# Patient Record
Sex: Male | Born: 1975 | Race: Black or African American | Hispanic: No | Marital: Single | Smoking: Never smoker
Health system: Southern US, Community
[De-identification: ages and names within clinical notes are randomized; demographics above are authoritative.]

## PROBLEM LIST (undated history)

## (undated) ENCOUNTER — Emergency Department (HOSPITAL_COMMUNITY): Payer: 59

## (undated) DIAGNOSIS — I639 Cerebral infarction, unspecified: Secondary | ICD-10-CM

## (undated) DIAGNOSIS — E785 Hyperlipidemia, unspecified: Secondary | ICD-10-CM

## (undated) DIAGNOSIS — I517 Cardiomegaly: Secondary | ICD-10-CM

## (undated) DIAGNOSIS — I10 Essential (primary) hypertension: Secondary | ICD-10-CM

## (undated) HISTORY — DX: Essential (primary) hypertension: I10

## (undated) HISTORY — PX: ANKLE FRACTURE SURGERY: SHX122

## (undated) HISTORY — DX: Cardiomegaly: I51.7

## (undated) HISTORY — DX: Hyperlipidemia, unspecified: E78.5

## (undated) HISTORY — DX: Cerebral infarction, unspecified: I63.9

---

## 2013-10-22 HISTORY — PX: ORIF ANKLE FRACTURE: SHX5408

## 2014-01-11 ENCOUNTER — Emergency Department (HOSPITAL_COMMUNITY): Payer: Self-pay

## 2014-01-11 ENCOUNTER — Encounter (HOSPITAL_COMMUNITY): Payer: Self-pay | Admitting: Emergency Medicine

## 2014-01-11 ENCOUNTER — Emergency Department (HOSPITAL_COMMUNITY)
Admission: EM | Admit: 2014-01-11 | Discharge: 2014-01-11 | Disposition: A | Discharge status: Home / Self Care | Payer: Self-pay | Attending: Emergency Medicine | Admitting: Emergency Medicine

## 2014-01-11 DIAGNOSIS — S8253XA Displaced fracture of medial malleolus of unspecified tibia, initial encounter for closed fracture: Secondary | ICD-10-CM | POA: Insufficient documentation

## 2014-01-11 DIAGNOSIS — S82391A Other fracture of lower end of right tibia, initial encounter for closed fracture: Secondary | ICD-10-CM

## 2014-01-11 DIAGNOSIS — S8251XA Displaced fracture of medial malleolus of right tibia, initial encounter for closed fracture: Secondary | ICD-10-CM

## 2014-01-11 DIAGNOSIS — S82899A Other fracture of unspecified lower leg, initial encounter for closed fracture: Secondary | ICD-10-CM | POA: Insufficient documentation

## 2014-01-11 DIAGNOSIS — Z7982 Long term (current) use of aspirin: Secondary | ICD-10-CM | POA: Insufficient documentation

## 2014-01-11 DIAGNOSIS — Y939 Activity, unspecified: Secondary | ICD-10-CM | POA: Insufficient documentation

## 2014-01-11 DIAGNOSIS — Y929 Unspecified place or not applicable: Secondary | ICD-10-CM | POA: Insufficient documentation

## 2014-01-11 DIAGNOSIS — S82831A Other fracture of upper and lower end of right fibula, initial encounter for closed fracture: Secondary | ICD-10-CM

## 2014-01-11 DIAGNOSIS — W108XXA Fall (on) (from) other stairs and steps, initial encounter: Secondary | ICD-10-CM | POA: Insufficient documentation

## 2014-01-11 MED ORDER — OXYCODONE-ACETAMINOPHEN 5-325 MG PO TABS: ORAL_TABLET | ORAL | Status: DC

## 2014-01-11 MED ORDER — IBUPROFEN 800 MG PO TABS
800.0000 mg | ORAL_TABLET | Freq: Once | ORAL | Status: AC
  Administered 2014-01-11: 800 mg via ORAL
  Filled 2014-01-11: qty 1

## 2014-01-11 MED ORDER — OXYCODONE-ACETAMINOPHEN 5-325 MG PO TABS
2.0000 | ORAL_TABLET | Freq: Once | ORAL | Status: AC
  Administered 2014-01-11: 2 via ORAL
  Filled 2014-01-11: qty 2

## 2014-01-11 MED ORDER — IBUPROFEN 600 MG PO TABS: 600.0000 mg | ORAL_TABLET | Freq: Four times a day (QID) | ORAL | Status: DC | PRN

## 2014-01-11 NOTE — ED Notes (Signed)
Pt given CD with images for orthopedic MD

## 2014-01-11 NOTE — ED Notes (Signed)
Pt fell down stairs yesterday.  Pt landed back on right foot/ankle.  Pt foot and ankle is swollen.  Took aspirin at home without relief.

## 2014-01-11 NOTE — ED Provider Notes (Signed)
CSN: [id number]     Arrival date & time 01/11/14  1233 History  This chart was scribed for non-physician practitioner, Noland Fordyce, PA-C working with Tanna Furry, MD by Frederich Balding, ED scribe. This patient was seen in room WTR7/WTR7 and the patient's care was started at 2:38 PM.   Chief Complaint  Patient presents with  . Ankle Injury   The history is provided by the patient. No language interpreter was used.   HPI Comments: Clovis Mankins is a 38 y.o. male who presents to the Emergency Department complaining of a fall that occurred yesterday. Pt tripped and fell down the steps and landed on his back and right ankle. Denies hitting his head or LOC. He has sudden onset right ankle pain with associated swelling. Pt also has mild numbness and tingling. He has taken two aspirin tablets yesterday with no relief. Bearing weight and movement worsen the pain. Denies knee pain. Denies history of diabetes. Pt has broken his left great toe in the past but denies prior surgeries.  History reviewed. No pertinent past medical history. History reviewed. No pertinent past surgical history. History reviewed. No pertinent family history. History  Substance Use Topics  . Smoking status: Never Smoker   . Smokeless tobacco: Not on file  . Alcohol Use: No    Review of Systems  Musculoskeletal: Positive for arthralgias and joint swelling.  Neurological: Positive for numbness.  All other systems reviewed and are negative.   Allergies  Quinine derivatives  Home Medications   Current Outpatient Rx  Name  Route  Sig  Dispense  Refill  . ASPIRIN PO   Oral   Take 2 tablets by mouth once.         Marland Kitchen ibuprofen (ADVIL,MOTRIN) 600 MG tablet   Oral   Take 1 tablet (600 mg total) by mouth every 6 (six) hours as needed.   30 tablet   0   . oxyCODONE-acetaminophen (PERCOCET/ROXICET) 5-325 MG per tablet      Take 1-2 pills every 4-6 hours as needed for pain.   15 tablet   0     BP 138/92  Pulse 84   Temp(Src) 99.3 F (37.4 C) (Oral)  Resp 18  SpO2 98%  Physical Exam  Nursing note and vitals reviewed. Constitutional: He is oriented to person, place, and time. He appears well-developed and well-nourished.  HENT:  Head: Normocephalic and atraumatic.  Eyes: EOM are normal.  Neck: Normal range of motion.  Cardiovascular: Normal rate.   Pulmonary/Chest: Effort normal.  Musculoskeletal: Normal range of motion.  Moderate to severe edema of right ankle and foot. Decreased ROM due to pain. Tenderness to lateral malleolus, medial malleolus and dorsum of foot. Dorsalis pedis 2+. Sensation to light touch intact. Skin is intact. No ecchymosis or erythema.   Neurological: He is alert and oriented to person, place, and time.  Skin: Skin is warm and dry.  Psychiatric: He has a normal mood and affect. His behavior is normal.    ED Course  Procedures (including critical care time)  DIAGNOSTIC STUDIES: Oxygen Saturation is 98% on RA, normal by my interpretation.    COORDINATION OF CARE: 2:42 PM-Discussed treatment plan which includes pain medication and orthopedic consult with pt at bedside and pt agreed to plan.   Labs Review Labs Reviewed - No data to display Imaging Review Dg Ankle Complete Right  01/11/2014   CLINICAL DATA:  Right foot and ankle pain post injury yesterday  EXAM: RIGHT ANKLE - COMPLETE 3+  VIEW  COMPARISON:  None.  FINDINGS: Three views of the right ankle submitted. There is displaced oblique fracture in distal right fibular shaft. Nondisplaced fracture is noted in distal tibia medial malleolus. There is significant soft tissue swelling adjacent to lateral malleolus. Ankle mortise is preserved. On lateral view there is subtle cortical step-off posterior aspect of distal tibia suspicious for subtle nondisplaced fracture of posterior malleolus.  IMPRESSION: There is displaced oblique fracture in distal right fibular shaft. Nondisplaced fracture is noted in distal tibia medial  malleolus. There is significant soft tissue swelling adjacent to lateral malleolus. Ankle mortise is preserved. On lateral view there is subtle cortical step-off posterior aspect of distal tibia suspicious for subtle nondisplaced fracture of posterior malleolus.   Electronically Signed   By: Lahoma Crocker M.D.   On: 01/11/2014 13:24   Dg Foot Complete Right  01/11/2014   CLINICAL DATA:  Injury to right foot and ankle.  EXAM: RIGHT FOOT COMPLETE - 3+ VIEW  COMPARISON:  DG ANKLE COMPLETE*R* dated 01/11/2014  FINDINGS: Again noted is the comminuted fracture involving the distal tibia. There is also a displaced fracture of the distal fibula. Alignment of the foot is normal but difficult to exclude an avulsion injury near the tarsometatarsal joints on the lateral view. There is diffuse soft tissue swelling.  IMPRESSION: Fractures involving the distal tibia and fibula.  Questionable area along the dorsal aspect of the tarsometatarsal joints on the lateral view. This could represent overlying bones but difficult to exclude an avulsion injury. Consider clinical correlation in this area.   Electronically Signed   By: Markus Daft M.D.   On: 01/11/2014 13:37     EKG Interpretation None      MDM   Final diagnoses:  Fracture of fibula, distal, right, closed  Fracture of medial malleolus, right, closed  Closed fracture of posterior malleolus of right tibia    Discussed pt with Dr. Jeneen Rinks, pt appears to have unstable ankle fracture requiring surgery, however, skin in tact. Foot is neurovascularly in tact. Ankle mortise is preserved. Will consult orthopedics, Dr. Percell Miller.  Dr. Percell Miller advised to splint leg, strict instructions to keep leg elevated above his heart and f/u with him in his office on Wednesday, 01/13/14 in the morning.  Discussed this with pt and family members who agreed with tx plan. Rx: percocet and ibuprofen. Return precautions provided. Pt verbalized understanding and agreement with tx plan.  I  personally performed the services described in this documentation, which was scribed in my presence. The recorded information has been reviewed and is accurate.   Noland Fordyce, PA-C 01/11/14 [id number]

## 2014-01-11 NOTE — Discharge Instructions (Signed)
Cast or Splint Care Casts and splints support injured limbs and keep bones from moving while they heal.  HOME CARE  Keep the cast or splint uncovered during the drying period.  A plaster cast can take 24 to 48 hours to dry.  A fiberglass cast will dry in less than 1 hour.  Do not rest the cast on anything harder than a pillow for 24 hours.  Do not put weight on your injured limb. Do not put pressure on the cast. Wait for your doctor's approval.  Keep the cast or splint dry.  Cover the cast or splint with a plastic bag during baths or wet weather.  If you have a cast over your chest and belly (trunk), take sponge baths until the cast is taken off.  If your cast gets wet, dry it with a towel or blow dryer. Use the cool setting on the blow dryer.  Keep your cast or splint clean. Wash a dirty cast with a damp cloth.  Do not put any objects under your cast or splint.  Do not scratch the skin under the cast with an object. If itching is a problem, use a blow dryer on a cool setting over the itchy area.  Do not trim or cut your cast.  Do not take out the padding from inside your cast.  Exercise your joints near the cast as told by your doctor.  Raise (elevate) your injured limb on 1 or 2 pillows for the first 1 to 3 days. GET HELP IF:  Your cast or splint cracks.  Your cast or splint is too tight or too loose.  You itch badly under the cast.  Your cast gets wet or has a soft spot.  You have a bad smell coming from the cast.  You get an object stuck under the cast.  Your skin around the cast becomes red or sore.  You have new or more pain after the cast is put on. GET HELP RIGHT AWAY IF:  You have fluid leaking through the cast.  You cannot move your fingers or toes.  Your fingers or toes turn blue or white or are cool, painful, or puffy (swollen).  You have tingling or lose feeling (numbness) around the injured area.  You have bad pain or pressure under the  cast.  You have trouble breathing or have shortness of breath.  You have chest pain. Document Released: 02/07/2011 Document Revised: 06/10/2013 Document Reviewed: 04/16/2013 [facility] Patient Information 2014 [place].  Displaced Fibular Fracture (Adult, Ankle) Treated With ORIF Your displaced fracture is at the part of the fibula that is located at your ankle. Displaced means that the bone is not lined up correctly. Because of this, the bones must be put back into position with a procedure called open reduction and internal fixation (ORIF). ORIF ankle surgery will provide the best chance for your ankle to heal right and decrease the chances of later arthritis and disability.  LET [facility] CARE PROVIDER KNOW ABOUT:  Any allergies you have.  All medicines you are taking, including vitamins, herbs, eye drops, creams, and over-the-counter medicines.  Previous problems you or members of your family have had with the use of anesthetics.  Any blood disorders you have.  Previous surgeries you have had.  Medical conditions you have. RISKS AND COMPLICATIONS Generally, this is a safe procedure. However, as with any procedure, complications can occur. Possible complications include:  Excessive bleeding.  Infection.  Posttraumatic arthritis.  Failure to  heal properly resulting in an unstable ankle.  Stiffness of the ankle after the repair. BEFORE THE PROCEDURE  Do not eat or drink after midnight the day before your procedure, or as instructed by your health care provider.   Ask your health care provider about changing or stopping any regular medicines. You may need to stop taking certain medicines up to 1 week before the procedure.   You may have lab tests the morning of the procedure.   Make arrangements for someone to drive you home after your procedure. PROCEDURE   An IV tube will be inserted into one of your veins.  You will receive fluids and medicine to make you  relax through this tube.  You will then be given medicines to make you sleep (general anesthetic).  A cut (incision) will be made on the outside of your ankle to expose the bone and clean it out.  The bone is then aligned back together, and screws and plates are used to hold this in place.  The skin and tissue are sewn back together to cover the repaired bone.  Dressings are placed over your incision. AFTER THE PROCEDURE  You will be taken to a recovery area and monitored until the anesthetic wears off.  You will be given pain medicine to control the pain.  You will be discharged after your pain is controlled and you can drink liquids. Document Released: 10/08/2005 Document Revised: 07/29/2013 Document Reviewed: 05/07/2013 [facility] Patient Information 2014 [place], [place].

## 2014-01-11 NOTE — Progress Notes (Signed)
[facility] provided pt with a list of primary care resources and a [facility] Orange Card application to help patient establish primary care.  °

## 2014-01-16 NOTE — ED Provider Notes (Signed)
Medical screening examination/treatment/procedure(s) were performed by non-physician practitioner and as supervising physician I was immediately available for consultation/collaboration.   EKG Interpretation None        Baker Kogler, MD 01/16/14 1047 

## 2015-10-07 IMAGING — CR DG ANKLE COMPLETE 3+V*R*
3 series · 3 of 3 positions shown · non-contrast
Comparison: None.

CLINICAL DATA: Right foot and ankle pain post injury yesterday

EXAM:
RIGHT ANKLE - COMPLETE 3+ VIEW

[x ankle ap right]
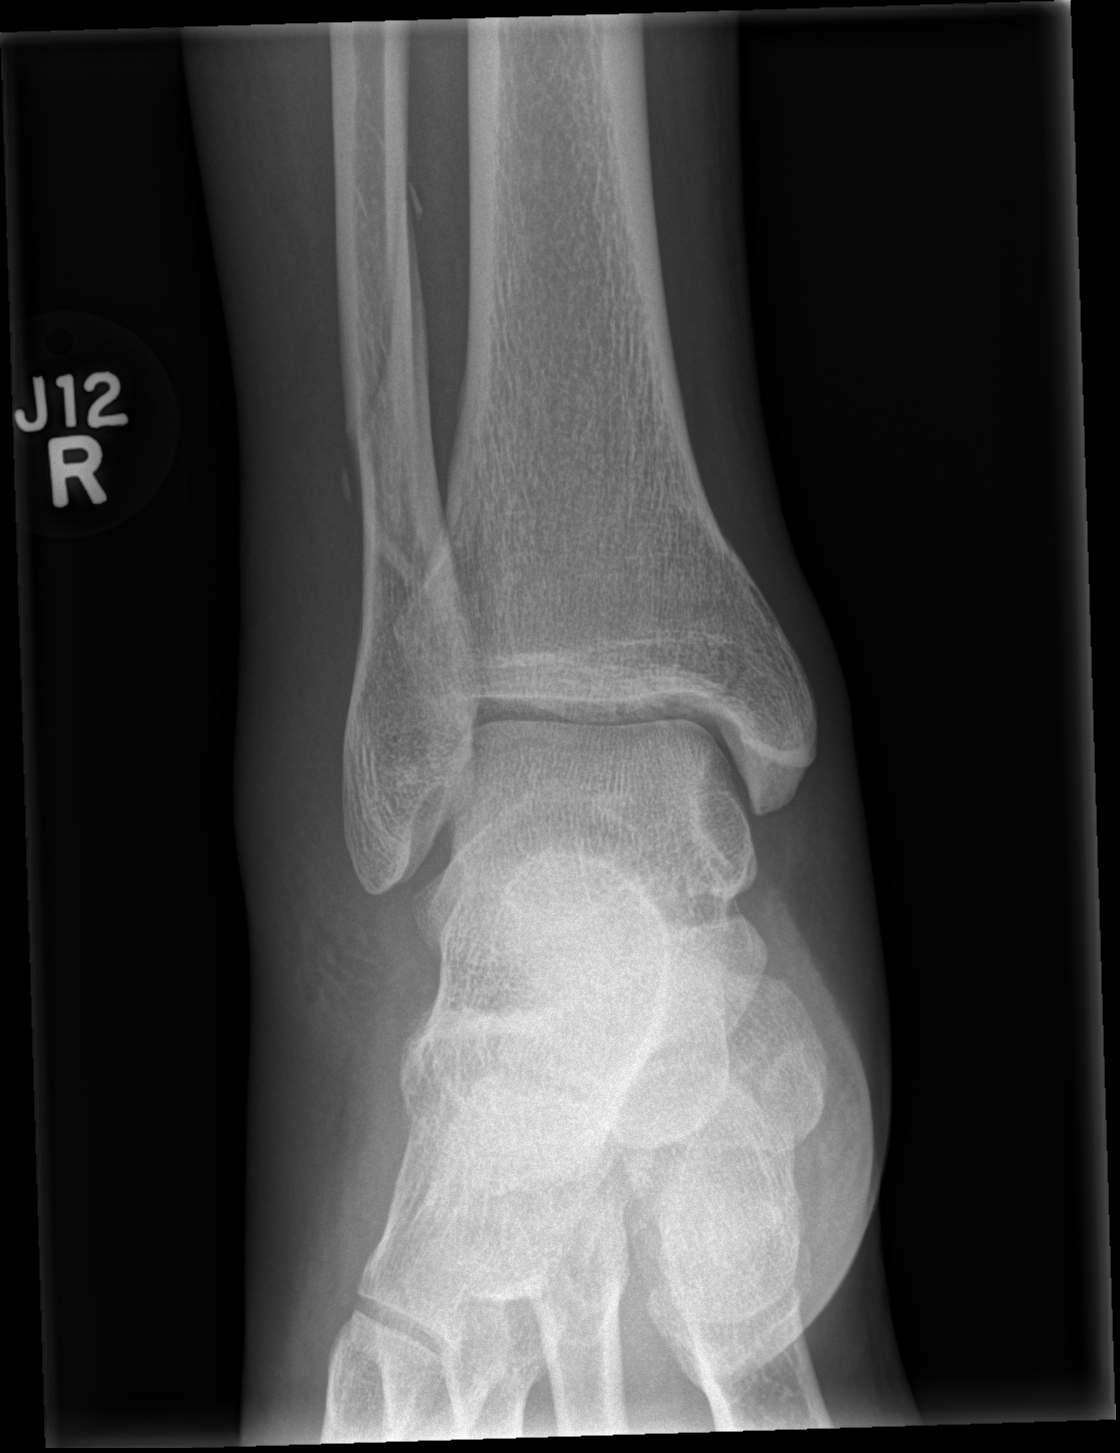

[x ankle obl right]
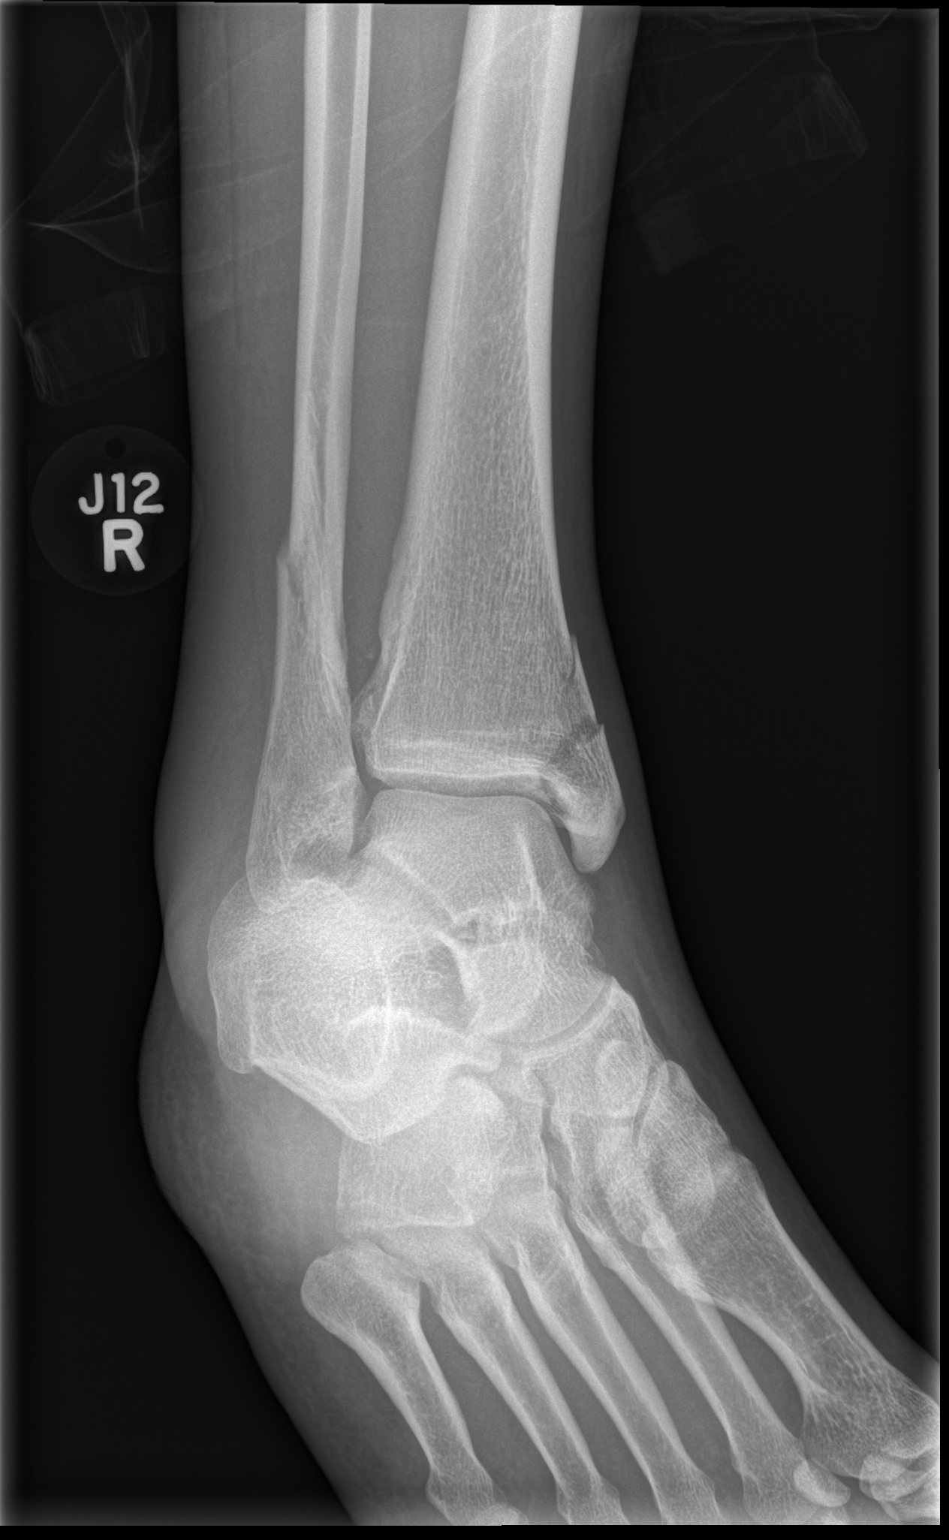

[x ankle lat right]
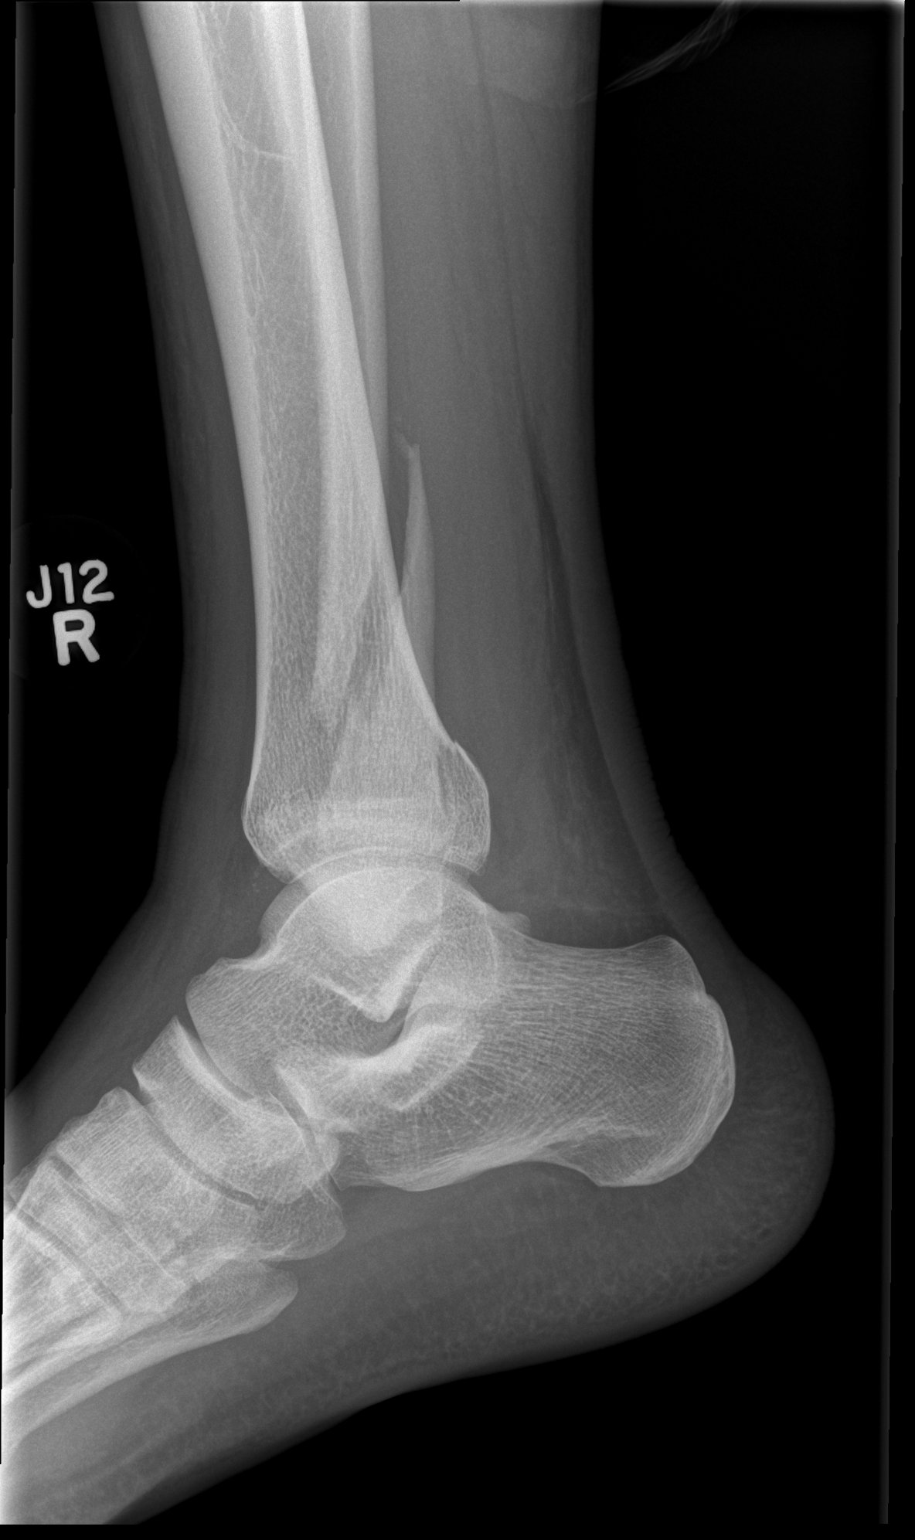

[3 of 3 positions shown; findings below may reference images not displayed]

FINDINGS: Three views of the right ankle submitted. There is displaced oblique
fracture in distal right fibular shaft. Nondisplaced fracture is
noted in distal tibia medial malleolus. There is significant soft
tissue swelling adjacent to lateral malleolus. Ankle mortise is
preserved. On lateral view there is subtle cortical step-off
posterior aspect of distal tibia suspicious for subtle nondisplaced
fracture of posterior malleolus.
IMPRESSION: There is displaced oblique fracture in distal right fibular shaft.
Nondisplaced fracture is noted in distal tibia medial malleolus.
There is significant soft tissue swelling adjacent to lateral
malleolus. Ankle mortise is preserved. On lateral view there is
subtle cortical step-off posterior aspect of distal tibia suspicious
for subtle nondisplaced fracture of posterior malleolus.

## 2015-11-23 ENCOUNTER — Encounter (HOSPITAL_BASED_OUTPATIENT_CLINIC_OR_DEPARTMENT_OTHER): Payer: Self-pay | Admitting: *Deleted

## 2015-11-23 ENCOUNTER — Emergency Department (HOSPITAL_BASED_OUTPATIENT_CLINIC_OR_DEPARTMENT_OTHER)
Admission: EM | Admit: 2015-11-23 | Discharge: 2015-11-23 | Disposition: A | Discharge status: Home / Self Care | Payer: BLUE CROSS/BLUE SHIELD | Attending: Emergency Medicine | Admitting: Emergency Medicine

## 2015-11-23 DIAGNOSIS — D170 Benign lipomatous neoplasm of skin and subcutaneous tissue of head, face and neck: Secondary | ICD-10-CM | POA: Diagnosis not present

## 2015-11-23 DIAGNOSIS — R221 Localized swelling, mass and lump, neck: Secondary | ICD-10-CM | POA: Diagnosis present

## 2015-11-23 DIAGNOSIS — D179 Benign lipomatous neoplasm, unspecified: Secondary | ICD-10-CM

## 2015-11-23 NOTE — ED Notes (Signed)
Patient states he has a growth behind in neck for the last three years.  States he was seen once when the area was small.  Denies pain, drainage or problems with rom.  States the growth continues to grow and he is concerned.

## 2015-11-23 NOTE — ED Provider Notes (Signed)
CSN: [id number]     Arrival date & time 11/23/15  [id number] History   First MD Initiated Contact with Patient 11/23/15 0957     Chief Complaint  Patient presents with  . Mass    cervical spine    HPI   40 year old male presents today with a mass to his posterior neck. Patient reports that approximately 3 years ago he noted a small bump on the right posterior neck that has slowly progressed. He reports the area is not painful, but has continued to progress to its present state. He denies any fever, chills, weight loss, night sweats, decreased range of motion of the neck, pain, or any other areas with similar findings. He denies any redness, discharge, warmth to touch.  History reviewed. No pertinent past medical history. Past Surgical History  Procedure Laterality Date  . Ankle fracture surgery Right    No family history on file. Social History  Substance Use Topics  . Smoking status: Never Smoker   . Smokeless tobacco: None  . Alcohol Use: No    Review of Systems  All other systems reviewed and are negative.   Allergies  Review of patient's allergies indicates no known allergies.  Home Medications   Prior to Admission medications   Medication Sig Start Date End Date Taking? Authorizing Provider  Acetaminophen (TYLENOL PO) Take by mouth.   Yes Historical Provider, MD   BP 146/99 mmHg  Pulse 66  Temp(Src) 98.4 F (36.9 C) (Oral)  Resp 20  Ht 5\' 3"  (1.6 m)  Wt 79 kg  BMI 30.86 kg/m2  SpO2 100%   Physical Exam  Constitutional: He is oriented to person, place, and time. He appears well-developed and well-nourished.  HENT:  Head: Normocephalic and atraumatic.  Eyes: Conjunctivae are normal. Pupils are equal, round, and reactive to light. Right eye exhibits no discharge. Left eye exhibits no discharge. No scleral icterus.  Neck: Normal range of motion. No JVD present. No tracheal deviation present.  Pulmonary/Chest: Effort normal. No stridor.  Musculoskeletal:  Large mass  to the right posterior neck approximately 6 cm in width, no fluctuance, mobile, well demarcated  Neurological: He is alert and oriented to person, place, and time. Coordination normal.  Psychiatric: He has a normal mood and affect. His behavior is normal. Judgment and thought content normal.  Nursing note and vitals reviewed.     ED Course  Procedures (including critical care time) Labs Review Labs Reviewed - No data to display  Imaging Review No results found. I have personally reviewed and evaluated these images and lab results as part of my medical decision-making.   EKG Interpretation None      MDM   Final diagnoses:  Lipoma    Labs:  Imaging:  Consults:  Therapeutics:  Discharge Meds:   Assessment/Plan: 57-year-old male presents with a mass to the back of his neck. This most likely represents a lipoma. Patient has no findings on physical historical exam that would indicate malignant nature of the mass. In any event patient will need follow-up with surgical consult for further evaluation. I informed patient that this is likely nonmalignant, but he would need definitive diagnosis with surgeon. I instructed patient to contact her surgeon this week for scheduled follow-up visit. Patient verbalized understanding and agreement today's plan and had no further questions or concerns at time of discharge.         Okey Regal, PA-C 11/23/15 1059  Julianne Rice, MD 11/23/15 [phone]

## 2015-11-23 NOTE — Discharge Instructions (Signed)

## 2017-01-28 ENCOUNTER — Encounter (HOSPITAL_COMMUNITY): Payer: Self-pay | Admitting: Emergency Medicine

## 2020-01-18 ENCOUNTER — Other Ambulatory Visit: Payer: Self-pay

## 2020-01-18 ENCOUNTER — Emergency Department (HOSPITAL_COMMUNITY): Payer: Managed Care, Other (non HMO)

## 2020-01-18 ENCOUNTER — Emergency Department (HOSPITAL_COMMUNITY)
Admission: EM | Admit: 2020-01-18 | Discharge: 2020-01-18 | Disposition: A | Discharge status: Home / Self Care | Payer: Managed Care, Other (non HMO) | Attending: Emergency Medicine | Admitting: Emergency Medicine

## 2020-01-18 ENCOUNTER — Encounter (HOSPITAL_COMMUNITY): Payer: Self-pay | Admitting: Emergency Medicine

## 2020-01-18 DIAGNOSIS — M7918 Myalgia, other site: Secondary | ICD-10-CM | POA: Diagnosis not present

## 2020-01-18 DIAGNOSIS — R509 Fever, unspecified: Secondary | ICD-10-CM | POA: Diagnosis present

## 2020-01-18 DIAGNOSIS — R05 Cough: Secondary | ICD-10-CM | POA: Insufficient documentation

## 2020-01-18 DIAGNOSIS — U071 COVID-19: Secondary | ICD-10-CM | POA: Insufficient documentation

## 2020-01-18 DIAGNOSIS — R110 Nausea: Secondary | ICD-10-CM | POA: Insufficient documentation

## 2020-01-18 LAB — POC SARS CORONAVIRUS 2 AG -  ED: SARS Coronavirus 2 Ag: POSITIVE — AB

## 2020-01-18 MED ORDER — ACETAMINOPHEN 325 MG PO TABS
650.0000 mg | ORAL_TABLET | Freq: Once | ORAL | Status: AC
Start: 2020-01-18 — End: 2020-01-18
  Administered 2020-01-18: 18:00:00 650 mg via ORAL
  Filled 2020-01-18: qty 2

## 2020-01-18 NOTE — Discharge Instructions (Signed)
Follow isolation precautions as discussed, follow CDC guidelines as discussed.  Return to ER if you develop difficulty breathing, chest pain or other new concerning symptom.  Use Tylenol Motrin as needed for pain control and body aches.

## 2020-01-18 NOTE — ED Triage Notes (Signed)
Pt to ED with c/o Covid like symptoms.  Pt st's last week he started having elevated temp, cough, and vomiting.   Pt st's he took Ibuprofen this am

## 2020-01-18 NOTE — ED Notes (Addendum)
Patient verbalizes understanding of discharge instructions. Opportunity for questioning and answers were provided. Armband removed by staff, pt discharged from ED stable & ambulatory  

## 2020-01-19 ENCOUNTER — Telehealth: Payer: Self-pay | Admitting: Unknown Physician Specialty

## 2020-01-19 ENCOUNTER — Telehealth (INDEPENDENT_AMBULATORY_CARE_PROVIDER_SITE_OTHER): Payer: Self-pay | Admitting: Adult Health

## 2020-01-19 ENCOUNTER — Telehealth: Payer: Self-pay | Admitting: Nurse Practitioner

## 2020-01-19 NOTE — Telephone Encounter (Signed)
Called to discuss with patient about Covid symptoms and the use of bamlanivimab, a monoclonal antibody infusion for those with mild to moderate Covid symptoms and at a high risk of hospitalization.  Pt may be qualified for this infusion at the [facility] infusion center  Message left to call back

## 2020-01-19 NOTE — Telephone Encounter (Signed)
Called to discuss with patient about SARS-CoV- 2 + test,   COVID-19 symptoms and the use of bamlanivimab, a monoclonal antibody infusion for those with mild to moderate Covid symptoms and at a high risk of hospitalization.  Pt did not qualify for infusion- BMI 31 (66 inches, 200 lbs) He does not have any other qualifying co-morbidities. Dicussed Red Flag sx's and if any develop seek immediate medical assistance, he verba.ized understanding/agreement.  Mina Marble, NP-C

## 2020-01-19 NOTE — Telephone Encounter (Signed)
Called to Discuss with patient about Covid symptoms and the use of bamlanivimab, a monoclonal antibody infusion for those with mild to moderate Covid symptoms and at a high risk of hospitalization.     Pt is qualified for this infusion at the [facility] infusion center due to co-morbid conditions and/or a member of an at-risk group.     Unable to reach pt  

## 2020-01-21 NOTE — ED Provider Notes (Signed)
[place] EMERGENCY DEPARTMENT Provider Note   CSN: [id number] Arrival date & time: 01/18/20  1621     History Chief Complaint  Patient presents with  . Possible Covid    Luis Roth is a 44 y.o. male.  Presents to ER with concern for Covid-like symptoms.  Patient states sometime last week he started having intermittent fever, unsure what temperature was at home.  Also having a dry cough, some nausea, no vomiting today.  Also been having some intermittent body aches.  No headache, no neck pain.  Has been taking ibuprofen for symptoms which seems to be helping.  Denies any difficulty in breathing or chest pain.  HPI     History reviewed. No pertinent past medical history.  There are no problems to display for this patient.   Past Surgical History:  Procedure Laterality Date  . ANKLE FRACTURE SURGERY Right        No family history on file.  Social History   Tobacco Use  . Smoking status: Never Smoker  . Smokeless tobacco: Never Used  Substance Use Topics  . Alcohol use: No  . Drug use: No    Home Medications Prior to Admission medications   Medication Sig Start Date End Date Taking? Authorizing Provider  Acetaminophen (TYLENOL PO) Take by mouth.    [provider]  ASPIRIN PO Take 2 tablets by mouth once.    [provider]  ibuprofen (ADVIL,MOTRIN) 600 MG tablet Take 1 tablet (600 mg total) by mouth every 6 (six) hours as needed. 01/11/14   Noe Gens, PA-C  oxyCODONE-acetaminophen (PERCOCET/ROXICET) 5-325 MG per tablet Take 1-2 pills every 4-6 hours as needed for pain. 01/11/14   Noe Gens, PA-C    Allergies    Quinine derivatives  Review of Systems   Review of Systems  Constitutional: Positive for chills and fever.  HENT: Negative for ear pain and sore throat.   Eyes: Negative for pain and visual disturbance.  Respiratory: Positive for cough. Negative for shortness of breath.   Cardiovascular: Negative for  chest pain and palpitations.  Gastrointestinal: Positive for nausea. Negative for abdominal pain and vomiting.  Genitourinary: Negative for dysuria and hematuria.  Musculoskeletal: Negative for arthralgias and back pain.  Skin: Negative for color change and rash.  Neurological: Negative for seizures and syncope.  All other systems reviewed and are negative.   Physical Exam Updated Vital Signs BP 120/84   Pulse 66   Temp 98.5 F (36.9 C) (Oral)   Resp 13   Wt 90.7 kg   SpO2 96%   BMI 35.43 kg/m   Physical Exam Vitals and nursing note reviewed.  Constitutional:      Appearance: He is well-developed.  HENT:     Head: Normocephalic and atraumatic.  Eyes:     Conjunctiva/sclera: Conjunctivae normal.  Cardiovascular:     Rate and Rhythm: Normal rate and regular rhythm.     Heart sounds: No murmur.  Pulmonary:     Effort: Pulmonary effort is normal. No respiratory distress.     Breath sounds: Normal breath sounds.  Abdominal:     Palpations: Abdomen is soft.     Tenderness: There is no abdominal tenderness.  Musculoskeletal:        General: No deformity or signs of injury.     Cervical back: Neck supple.  Skin:    General: Skin is warm and dry.  Neurological:     General: No focal deficit present.  Mental Status: He is alert and oriented to person, place, and time.  Psychiatric:        Mood and Affect: Mood normal.        Behavior: Behavior normal.     ED Results / Procedures / Treatments   Labs (all labs ordered are listed, but only abnormal results are displayed) Labs Reviewed  POC SARS CORONAVIRUS 2 AG -  ED - Abnormal; Notable for the following components:      Result Value   SARS Coronavirus 2 Ag POSITIVE (*)    All other components within normal limits    EKG None  Radiology No results found.  Procedures Procedures (including critical care time)  Medications Ordered in ED Medications  acetaminophen (TYLENOL) tablet 650 mg (650 mg Oral Given  01/18/20 1748)    ED Course  I have reviewed the triage vital signs and the nursing notes.  Pertinent labs & imaging results that were available during my care of the patient were reviewed by me and considered in my medical decision making (see chart for details).    MDM Rules/Calculators/A&P                      44 year old male presents to ER with Covid-like symptoms.  Point-of-care Covid test was positive.  CXR with mild patchy opacities consistent with Covid pneumonia.  No chest pain, no difficulty breathing, no pulmonary embolism or superficial imposed bacterial pneumonia.  He is well-appearing, no increased work of breathing, no hypoxia, believe he is appropriate for discharge and outpatient management at this time.  Reviewed precautions, isolation precautions. Rec recheck virtual with PCP.    After the discussed management above, the patient was determined to be safe for discharge.  The patient was in agreement with this plan and all questions regarding their care were answered.  ED return precautions were discussed and the patient will return to the ED with any significant worsening of condition.   Final Clinical Impression(s) / ED Diagnoses Final diagnoses:  [id number]    Rx / DC Orders ED Discharge Orders    None       Lucrezia Starch, MD 01/21/20 1514

## 2020-01-27 ENCOUNTER — Other Ambulatory Visit: Payer: Self-pay

## 2020-01-27 ENCOUNTER — Emergency Department (HOSPITAL_COMMUNITY)
Admission: EM | Admit: 2020-01-27 | Discharge: 2020-01-27 | Disposition: A | Discharge status: Home / Self Care | Payer: Managed Care, Other (non HMO) | Attending: Emergency Medicine | Admitting: Emergency Medicine

## 2020-01-27 ENCOUNTER — Encounter (HOSPITAL_COMMUNITY): Payer: Self-pay | Admitting: Pediatrics

## 2020-01-27 DIAGNOSIS — I10 Essential (primary) hypertension: Secondary | ICD-10-CM | POA: Insufficient documentation

## 2020-01-27 DIAGNOSIS — R05 Cough: Secondary | ICD-10-CM | POA: Diagnosis not present

## 2020-01-27 DIAGNOSIS — Z8616 Personal history of COVID-19: Secondary | ICD-10-CM | POA: Diagnosis not present

## 2020-01-27 DIAGNOSIS — Z79899 Other long term (current) drug therapy: Secondary | ICD-10-CM | POA: Insufficient documentation

## 2020-01-27 DIAGNOSIS — U071 COVID-19: Secondary | ICD-10-CM

## 2020-01-27 LAB — BASIC METABOLIC PANEL
Anion gap: 9 (ref 5–15)
BUN: 5 mg/dL — ABNORMAL LOW (ref 6–20)
CO2: 24 mmol/L (ref 22–32)
Calcium: 9.4 mg/dL (ref 8.9–10.3)
Chloride: 104 mmol/L (ref 98–111)
Creatinine, Ser: 1.1 mg/dL (ref 0.61–1.24)
GFR calc Af Amer: >60 mL/min (ref >60)
GFR calc non Af Amer: >60 mL/min (ref >60)
Glucose, Bld: 96 mg/dL (ref 70–99)
Potassium: 4.4 mmol/L (ref 3.5–5.1)
Sodium: 137 mmol/L (ref 135–145)

## 2020-01-27 NOTE — ED Notes (Signed)
Patient verbalizes understanding of discharge instructions and follow up care regarding HTN. Opportunity for questioning and answers were provided. all questions answered completely. PIV removed, catheter intact. Site dressed with gauze and tape. Armband removed by staff, pt discharged from ED. Ambulatory from ED with strong, steady gait

## 2020-01-27 NOTE — ED Notes (Signed)
PIV initiated, 20G to [place]. IV flushes with 10 cc NS without s/s of infiltration. Positive blood return noted. Secured with tape and tegadem. Labs drawn, labeled with 2 pt identifiers, and sent to lab

## 2020-01-27 NOTE — ED Triage Notes (Signed)
Pt presents to ED via POV; Stated his quarantine dates tomorrow; and still have intermittent cough

## 2020-01-27 NOTE — ED Notes (Signed)
Pts BP has not been running high so tech moved to opposite arm and rechecked, still at 197/111

## 2020-01-27 NOTE — ED Provider Notes (Signed)
[facility] EMERGENCY DEPARTMENT Provider Note   CSN: [id number] Arrival date & time: 01/27/20  1353     History Chief Complaint  Patient presents with  . Cough    Luis Roth is a 44 y.o. male with a past medical history of COVID-19 who developed symptoms a little over 10 days ago.  He reports that his work notes that he could go back to work tomorrow and so he wants to make sure he is okay.  He denies any cough.  He is not having any fevers.  He reports that he is still taking zinc, and vitamin C.  He states that he has occasionally been using Robitussin however has not had any recently.  He denies any stimulant use or significant increase in caffeine.    He does not have a known history of hypertension.  HPI     History reviewed. No pertinent past medical history.  There are no problems to display for this patient.   Past Surgical History:  Procedure Laterality Date  . ANKLE FRACTURE SURGERY Right        No family history on file.  Social History   Tobacco Use  . Smoking status: Never Smoker  . Smokeless tobacco: Never Used  Substance Use Topics  . Alcohol use: No  . Drug use: No    Home Medications Prior to Admission medications   Medication Sig Start Date End Date Taking? Authorizing Provider  ascorbic acid (VITAMIN C) 500 MG tablet Take 500 mg by mouth daily.   Yes [provider]  ibuprofen (ADVIL,MOTRIN) 600 MG tablet Take 1 tablet (600 mg total) by mouth every 6 (six) hours as needed. Patient taking differently: Take 600 mg by mouth every 6 (six) hours as needed for moderate pain.  01/11/14  Yes Phelps, Erin O, PA-C  Multiple Vitamins-Minerals (ZINC PO) Take 1 tablet by mouth in the morning and at bedtime.   Yes [provider]  VITAMIN D PO Take 1 tablet by mouth daily.   Yes [provider]  oxyCODONE-acetaminophen (PERCOCET/ROXICET) 5-325 MG per tablet Take 1-2 pills every 4-6 hours as needed for  pain. Patient not taking: Reported on 01/27/2020 01/11/14   Noe Gens, PA-C    Allergies    Quinine derivatives  Review of Systems   Review of Systems  Constitutional: Negative for chills, fatigue and fever.  Respiratory: Positive for cough (Occasionally). Negative for shortness of breath.   Cardiovascular: Negative for chest pain, palpitations and leg swelling.  Gastrointestinal: Negative for abdominal pain, diarrhea, nausea and vomiting.  Musculoskeletal: Negative for arthralgias and myalgias.  Skin: Negative for color change and rash.  Neurological: Negative for weakness and headaches.  Psychiatric/Behavioral: Negative for confusion.  All other systems reviewed and are negative.   Physical Exam Updated Vital Signs BP (!) 183/111   Pulse (!) 51   Temp 97.7 F (36.5 C) (Oral)   Resp (!) 22   SpO2 98%   Physical Exam Vitals and nursing note reviewed.  Constitutional:      General: He is not in acute distress.    Appearance: He is well-developed. He is not diaphoretic.  HENT:     Head: Normocephalic and atraumatic.  Eyes:     General: No scleral icterus.       Right eye: No discharge.        Left eye: No discharge.     Conjunctiva/sclera: Conjunctivae normal.  Cardiovascular:     Rate and Rhythm:  Normal rate and regular rhythm.     Pulses: Normal pulses.     Heart sounds: Normal heart sounds.  Pulmonary:     Effort: Pulmonary effort is normal. No respiratory distress.     Breath sounds: Normal breath sounds. No stridor.  Abdominal:     General: There is no distension.  Musculoskeletal:     Cervical back: Normal range of motion and neck supple. No rigidity.     Right lower leg: No edema.     Left lower leg: No edema.  Skin:    General: Skin is warm and dry.  Neurological:     General: No focal deficit present.     Mental Status: He is alert and oriented to person, place, and time. Mental status is at baseline.     Cranial Nerves: No cranial nerve deficit.      Motor: No weakness or abnormal muscle tone.     Gait: Gait normal.  Psychiatric:        Mood and Affect: Mood normal.        Behavior: Behavior normal.     ED Results / Procedures / Treatments   Labs (all labs ordered are listed, but only abnormal results are displayed) Labs Reviewed  BASIC METABOLIC PANEL - Abnormal; Notable for the following components:      Result Value   BUN 5 (*)    All other components within normal limits    EKG None  Radiology No results found.  Procedures Procedures (including critical care time)  Medications Ordered in ED Medications - No data to display  ED Course  I have reviewed the triage vital signs and the nursing notes.  Pertinent labs & imaging results that were available during my care of the patient were reviewed by me and considered in my medical decision making (see chart for details).  Clinical Course as of Jan 26 2258  Wed Jan 27, 2020  2124 I personally had patient ambulate in place while in the room.  He did not develop any symptoms and remained 99 to 100% on room air without significant tachycardia.     [EH]    Clinical Course User Index [EH] Ollen Gross   MDM Rules/Calculators/A&P                     Patient presents today for evaluation of Covid.  He developed his symptoms 10 days ago.  He wants to make sure he is okay to come off quarantine.  He reports he has occasional cough however otherwise feels fine.  He denies any shortness of breath or fatigue.  He is afebrile, not tachycardic or tachypneic. He was able to ambulate without desaturation, denies any leg pain or swelling or any residual symptoms other than an occasional cough.  Discussed with patient CDC recommendations for when you can come out of isolation after being diagnosed Covid positive.  Here he was noted to be hypertensive.  He denied headache, chest pain shortness of breath or any neurologic symptoms.  This was in the high 130s over 90s  while in the waiting room however jumped to the high 123XX123 systolic once he was roomed.  He does report that he has occasionally been taking cough medicine that contains decongestants.  I recommended that he stop doing this.  Recommended outpatient follow-up with repeat blood pressure ,Check in 2 weeks.  In addition recommended not using decongestants.  Given the sudden onset of his hypertension BMP was  obtained showing normal renal function.  He is not having chest pain or shortness of breath therefore EKG was not obtained.  Luis Roth was evaluated in Emergency Department on 01/27/2020 for the symptoms described in the history of present illness. He was evaluated in the context of the global COVID-19 pandemic, which necessitated consideration that the patient might be at risk for infection with the SARS-CoV-2 virus that causes COVID-19. Institutional protocols and algorithms that pertain to the evaluation of patients at risk for COVID-19 are in a state of rapid change based on information released by regulatory bodies including the CDC and federal and state organizations. These policies and algorithms were followed during the patient's care in the ED.   Return precautions were discussed with patient who states their understanding.  At the time of discharge patient denied any unaddressed complaints or concerns.  Patient is agreeable for discharge home.  Note: Portions of this report may have been transcribed using voice recognition software. Every effort was made to ensure accuracy; however, inadvertent computerized transcription errors may be present  Final Clinical Impression(s) / ED Diagnoses Final diagnoses:  COVID-19  Hypertension, unspecified type    Rx / DC Orders ED Discharge Orders    None       Ollen Gross 01/27/20 2259    Dorie Rank, MD 01/28/20 1029

## 2020-01-27 NOTE — ED Notes (Signed)
EDP at bedside  

## 2020-01-27 NOTE — Discharge Instructions (Addendum)
Your blood work was good today.   To return to work and come off isolation/quarantine you must meet the below criteria. At least 10 days since symptoms first appeared and At least 24 hours with no fever without fever-reducing medication and Symptoms have improved While in the ED your blood pressure was high.  Please follow up with your primary care doctor or the wellness clinic for repeat evaluation as you may need medication.  High blood pressure can cause long term, potentially serious, damage if left untreated.   Do not take any medications with decongestants. If needed for cough you may take Coricidin BP as this will not significantly elevate your blood pressure.

## 2020-06-21 ENCOUNTER — Ambulatory Visit: Payer: Managed Care, Other (non HMO) | Attending: Critical Care Medicine

## 2020-06-21 DIAGNOSIS — Z23 Encounter for immunization: Secondary | ICD-10-CM

## 2020-06-21 NOTE — Progress Notes (Signed)
   Covid-19 Vaccination Clinic  Name:  Luis Roth    MRN: [id number] DOB: [birth year]  06/21/2020  [name] was observed post Covid-19 immunization for 15 minutes without incident. He was provided with Vaccine Information Sheet and instruction to access the V-Safe system.   [name] was instructed to call 911 with any severe reactions post vaccine: Marland Kitchen Difficulty breathing  . Swelling of face and throat  . A fast heartbeat  . A bad rash all over body  . Dizziness and weakness   Immunizations Administered    Name Date Dose VIS Date Route   Moderna COVID-19 Vaccine 06/21/2020  9:13 AM 0.5 mL 09/2019 Intramuscular   Manufacturer: Moderna   Lot: [id number]   [place]: [id number]

## 2020-07-19 ENCOUNTER — Ambulatory Visit: Payer: Managed Care, Other (non HMO) | Attending: Internal Medicine

## 2020-07-19 DIAGNOSIS — Z23 Encounter for immunization: Secondary | ICD-10-CM

## 2020-07-19 NOTE — Progress Notes (Signed)
   Covid-19 Vaccination Clinic  Name:  Luis Roth    MRN: [id number] DOB: [birth year]  07/19/2020  [name] was observed post Covid-19 immunization for 15 minutes without incident. He was provided with Vaccine Information Sheet and instruction to access the V-Safe system.   [name] was instructed to call 911 with any severe reactions post vaccine: Marland Kitchen Difficulty breathing  . Swelling of face and throat  . A fast heartbeat  . A bad rash all over body  . Dizziness and weakness   Immunizations Administered    Name Date Dose VIS Date Route   Moderna COVID-19 Vaccine 07/19/2020  9:15 AM 0.5 mL 09/2019 Intramuscular   Manufacturer: Moderna   Lot: [id number]   [place]: [phone]

## 2022-11-02 ENCOUNTER — Emergency Department (HOSPITAL_COMMUNITY): Payer: 59

## 2022-11-02 ENCOUNTER — Inpatient Hospital Stay (HOSPITAL_COMMUNITY)
Admission: EM | Admit: 2022-11-02 | Discharge: 2022-11-12 | DRG: 065 | Disposition: A | Discharge status: Skilled Nursing Facility | Payer: 59 | Attending: Family Medicine | Admitting: Family Medicine

## 2022-11-02 ENCOUNTER — Observation Stay (HOSPITAL_COMMUNITY): Payer: 59

## 2022-11-02 ENCOUNTER — Other Ambulatory Visit: Payer: Self-pay

## 2022-11-02 ENCOUNTER — Encounter (HOSPITAL_COMMUNITY): Payer: Self-pay

## 2022-11-02 DIAGNOSIS — R4189 Other symptoms and signs involving cognitive functions and awareness: Secondary | ICD-10-CM | POA: Diagnosis present

## 2022-11-02 DIAGNOSIS — I6389 Other cerebral infarction: Secondary | ICD-10-CM | POA: Diagnosis not present

## 2022-11-02 DIAGNOSIS — R9431 Abnormal electrocardiogram [ECG] [EKG]: Secondary | ICD-10-CM | POA: Diagnosis present

## 2022-11-02 DIAGNOSIS — I63521 Cerebral infarction due to unspecified occlusion or stenosis of right anterior cerebral artery: Secondary | ICD-10-CM | POA: Diagnosis not present

## 2022-11-02 DIAGNOSIS — Z6835 Body mass index (BMI) 35.0-35.9, adult: Secondary | ICD-10-CM

## 2022-11-02 DIAGNOSIS — I160 Hypertensive urgency: Secondary | ICD-10-CM | POA: Diagnosis present

## 2022-11-02 DIAGNOSIS — Z888 Allergy status to other drugs, medicaments and biological substances status: Secondary | ICD-10-CM

## 2022-11-02 DIAGNOSIS — E785 Hyperlipidemia, unspecified: Secondary | ICD-10-CM | POA: Diagnosis present

## 2022-11-02 DIAGNOSIS — I639 Cerebral infarction, unspecified: Secondary | ICD-10-CM | POA: Diagnosis not present

## 2022-11-02 DIAGNOSIS — Z79899 Other long term (current) drug therapy: Secondary | ICD-10-CM

## 2022-11-02 DIAGNOSIS — Z823 Family history of stroke: Secondary | ICD-10-CM

## 2022-11-02 DIAGNOSIS — M4802 Spinal stenosis, cervical region: Secondary | ICD-10-CM | POA: Diagnosis present

## 2022-11-02 DIAGNOSIS — R29707 NIHSS score 7: Secondary | ICD-10-CM | POA: Diagnosis present

## 2022-11-02 DIAGNOSIS — I10 Essential (primary) hypertension: Secondary | ICD-10-CM | POA: Diagnosis present

## 2022-11-02 DIAGNOSIS — I517 Cardiomegaly: Secondary | ICD-10-CM | POA: Diagnosis present

## 2022-11-02 DIAGNOSIS — G8194 Hemiplegia, unspecified affecting left nondominant side: Secondary | ICD-10-CM | POA: Diagnosis present

## 2022-11-02 DIAGNOSIS — E669 Obesity, unspecified: Secondary | ICD-10-CM | POA: Diagnosis present

## 2022-11-02 LAB — ETHANOL: Alcohol, Ethyl (B): <10 mg/dL (ref <10)

## 2022-11-02 LAB — ECHOCARDIOGRAM COMPLETE
Area-P 1/2: 3.42 cm2
Height: 63 in
S' Lateral: 2.9 cm
Weight: 3200 oz

## 2022-11-02 LAB — TROPONIN I (HIGH SENSITIVITY)
Troponin I (High Sensitivity): 15 ng/L (ref <18)
Troponin I (High Sensitivity): 17 ng/L (ref <18)

## 2022-11-02 LAB — DIFFERENTIAL
Abs Immature Granulocytes: 0.01 10*3/uL (ref 0.00–0.07)
Basophils Absolute: 0 10*3/uL (ref 0.0–0.1)
Basophils Relative: 0 %
Eosinophils Absolute: 0.1 10*3/uL (ref 0.0–0.5)
Eosinophils Relative: 1 %
Immature Granulocytes: 0 %
Lymphocytes Relative: 25 %
Lymphs Abs: 1.6 10*3/uL (ref 0.7–4.0)
Monocytes Absolute: 0.4 10*3/uL (ref 0.1–1.0)
Monocytes Relative: 6 %
Neutro Abs: 4.3 10*3/uL (ref 1.7–7.7)
Neutrophils Relative %: 68 %

## 2022-11-02 LAB — URINALYSIS, ROUTINE W REFLEX MICROSCOPIC
Bacteria, UA: NONE SEEN
Bilirubin Urine: NEGATIVE
Glucose, UA: NEGATIVE mg/dL
Hgb urine dipstick: NEGATIVE
Ketones, ur: NEGATIVE mg/dL
Leukocytes,Ua: NEGATIVE
Nitrite: NEGATIVE
Protein, ur: 30 mg/dL — AB
Specific Gravity, Urine: >1.046 — ABNORMAL HIGH (ref 1.005–1.030)
pH: 5 (ref 5.0–8.0)

## 2022-11-02 LAB — I-STAT CHEM 8, ED
BUN: 14 mg/dL (ref 6–20)
Calcium, Ion: 1.17 mmol/L (ref 1.15–1.40)
Chloride: 103 mmol/L (ref 98–111)
Creatinine, Ser: 1.2 mg/dL (ref 0.61–1.24)
Glucose, Bld: 144 mg/dL — ABNORMAL HIGH (ref 70–99)
HCT: 41 % (ref 39.0–52.0)
Hemoglobin: 13.9 g/dL (ref 13.0–17.0)
Potassium: 3.5 mmol/L (ref 3.5–5.1)
Sodium: 140 mmol/L (ref 135–145)
TCO2: 26 mmol/L (ref 22–32)

## 2022-11-02 LAB — CBC
HCT: 40.3 % (ref 39.0–52.0)
Hemoglobin: 13.6 g/dL (ref 13.0–17.0)
MCH: 30.4 pg (ref 26.0–34.0)
MCHC: 33.7 g/dL (ref 30.0–36.0)
MCV: 90.2 fL (ref 80.0–100.0)
Platelets: 245 10*3/uL (ref 150–400)
RBC: 4.47 MIL/uL (ref 4.22–5.81)
RDW: 13.4 % (ref 11.5–15.5)
WBC: 6.3 10*3/uL (ref 4.0–10.5)
nRBC: 0 % (ref 0.0–0.2)

## 2022-11-02 LAB — COMPREHENSIVE METABOLIC PANEL
ALT: 27 U/L (ref 0–44)
AST: 32 U/L (ref 15–41)
Albumin: 4 g/dL (ref 3.5–5.0)
Alkaline Phosphatase: 76 U/L (ref 38–126)
Anion gap: 8 (ref 5–15)
BUN: 13 mg/dL (ref 6–20)
CO2: 24 mmol/L (ref 22–32)
Calcium: 9.3 mg/dL (ref 8.9–10.3)
Chloride: 104 mmol/L (ref 98–111)
Creatinine, Ser: 1.29 mg/dL — ABNORMAL HIGH (ref 0.61–1.24)
GFR, Estimated: >60 mL/min (ref >60)
Glucose, Bld: 147 mg/dL — ABNORMAL HIGH (ref 70–99)
Potassium: 3.5 mmol/L (ref 3.5–5.1)
Sodium: 136 mmol/L (ref 135–145)
Total Bilirubin: 0.6 mg/dL (ref 0.3–1.2)
Total Protein: 7.5 g/dL (ref 6.5–8.1)

## 2022-11-02 LAB — RAPID URINE DRUG SCREEN, HOSP PERFORMED
Amphetamines: NOT DETECTED
Barbiturates: NOT DETECTED
Benzodiazepines: NOT DETECTED
Cocaine: NOT DETECTED
Opiates: NOT DETECTED
Tetrahydrocannabinol: NOT DETECTED

## 2022-11-02 LAB — HIV ANTIBODY (ROUTINE TESTING W REFLEX): HIV Screen 4th Generation wRfx: NONREACTIVE

## 2022-11-02 LAB — PROTIME-INR
INR: 1 (ref 0.8–1.2)
Prothrombin Time: 13.5 seconds (ref 11.4–15.2)

## 2022-11-02 LAB — APTT: aPTT: 31 seconds (ref 24–36)

## 2022-11-02 MED ORDER — ASPIRIN 81 MG PO TBEC
81.0000 mg | DELAYED_RELEASE_TABLET | Freq: Every day | ORAL | Status: DC
  Administered 2022-11-02 – 2022-11-12 (×11): 81 mg via ORAL
  Filled 2022-11-02 (×11): qty 1

## 2022-11-02 MED ORDER — ACETAMINOPHEN 650 MG RE SUPP: 650.0000 mg | Freq: Four times a day (QID) | RECTAL | Status: DC | PRN

## 2022-11-02 MED ORDER — ENOXAPARIN SODIUM 40 MG/0.4ML IJ SOSY: 40.0000 mg | PREFILLED_SYRINGE | INTRAMUSCULAR | Status: DC

## 2022-11-02 MED ORDER — ONDANSETRON HCL 4 MG PO TABS
4.0000 mg | ORAL_TABLET | Freq: Four times a day (QID) | ORAL | Status: DC | PRN
  Administered 2022-11-06: 4 mg via ORAL
  Filled 2022-11-02: qty 1

## 2022-11-02 MED ORDER — POLYETHYLENE GLYCOL 3350 17 G PO PACK
17.0000 g | PACK | Freq: Every day | ORAL | Status: DC | PRN
  Administered 2022-11-05 – 2022-11-07 (×3): 17 g via ORAL
  Filled 2022-11-02 (×4): qty 1

## 2022-11-02 MED ORDER — ACETAMINOPHEN 325 MG PO TABS
650.0000 mg | ORAL_TABLET | Freq: Four times a day (QID) | ORAL | Status: DC | PRN
  Administered 2022-11-04 – 2022-11-06 (×2): 650 mg via ORAL
  Filled 2022-11-02 (×2): qty 2

## 2022-11-02 MED ORDER — IOHEXOL 350 MG/ML SOLN
75.0000 mL | Freq: Once | INTRAVENOUS | Status: AC | PRN
  Administered 2022-11-02: 75 mL via INTRAVENOUS

## 2022-11-02 MED ORDER — LACTATED RINGERS IV SOLN: INTRAVENOUS | Status: AC

## 2022-11-02 MED ORDER — ONDANSETRON HCL 4 MG/2ML IJ SOLN: 4.0000 mg | Freq: Four times a day (QID) | INTRAMUSCULAR | Status: DC | PRN

## 2022-11-02 NOTE — Progress Notes (Signed)
FMTS Interim Progress Note  S:Night rounded w/ Dr. Nancy Fetter. Pt is alert in bed, responding appropriately. denies any new changes or discomfort. Says he feels better since admission.  O: BP (!) 188/107 (BP Location: Left Arm)   Pulse (!) 54   Temp 98.4 F (36.9 C) (Oral)   Resp 17   Ht '5\' 3"'$  (1.6 m)   Wt 90.7 kg   SpO2 100%   BMI 35.43 kg/m     A/P: Stroke - BP steadily improving since admission. - Plan per day team  Arlyce Dice, MD 11/02/2022, 9:31 PM PGY-1, [facility] Service pager [phone]

## 2022-11-02 NOTE — Assessment & Plan Note (Addendum)
Stable, awaiting SNF.  - Atorvastatin '80mg'$  daily - Plavix 75 mg for 3 weeks - ASA '81mg'$  daily - amlodipine 10 mg (started 1/14) - Losartan to 100 mg (started 1/16) - PT/OT - Fall precautions

## 2022-11-02 NOTE — Progress Notes (Signed)
  Echocardiogram 2D Echocardiogram has been performed.  Luis Roth 11/02/2022, 4:45 PM

## 2022-11-02 NOTE — ED Provider Notes (Addendum)
2:03 PM I discussed patient's abnormal EKG with Dr. Johnsie Cancel.  Given no chest pain and strokelike symptoms, this is likely more LVH.  However he does recommend getting troponins and an echo as he will get a stroke workup anyway to help make sure there is no structural heart disease.  No STEMI at this time.  On my assessment patient is not having any chest pain but is only having neurosymptoms.   Sherwood Gambler, MD 11/02/22 [phone]

## 2022-11-02 NOTE — Progress Notes (Signed)
Received male pt from ER, alert oriented, not in distress, on room air, transferred to room 22, assisted, pt had severe weakness on the left side of the body, put pt on tele box, informed tele dept. Skin integrity assessed, bth given by NT, activities well tolerated

## 2022-11-02 NOTE — Consult Note (Signed)
NEUROLOGY CONSULTATION NOTE   Date of service: November 02, 2022 Patient Name: Luis Roth MRN:  [id number] DOB:  January 25, 1976 Reason for consult: "stroke" Requesting Provider: Lenoria Chime, MD   History of Present Illness  Beth Spackman is a 47 y.o. male with PMH significant for  has no past medical history on file. who presents to ED c/o left-sided weakness and inability to stand. MRI showed subacute stroke and neurology was consulted.   LKW: Wednesday TNK: No, outside of window GCS: 15 NIHSS: 7  NIHSS components Score: Comment  1a Level of Conscious 0'[x]'$  1'[]'$  2'[]'$  3'[]'$      1b LOC Questions 0'[x]'$  1'[]'$  2'[]'$       1c LOC Commands 0'[x]'$  1'[]'$  2'[]'$       2 Best Gaze 0'[x]'$  1'[]'$  2'[]'$       3 Visual 0'[x]'$  1'[]'$  2'[]'$  3'[]'$      4 Facial Palsy 0'[x]'$  1'[]'$  2'[]'$  3'[]'$      5a Motor Arm - left 0'[]'$  1'[]'$  2'[x]'$  3'[]'$  4'[]'$  UN'[]'$    5b Motor Arm - Right 0'[x]'$  1'[]'$  2'[]'$  3'[]'$  4'[]'$  UN'[]'$    6a Motor Leg - Left 0'[]'$  1'[]'$  2'[]'$  3'[]'$  4'[x]'$  UN'[]'$    6b Motor Leg - Right 0'[x]'$  1'[]'$  2'[]'$  3'[]'$  4'[]'$  UN'[]'$    7 Limb Ataxia 0'[]'$  1'[x]'$  2'[]'$  3'[]'$  UN'[]'$     8 Sensory 0'[x]'$  1'[]'$  2'[]'$  UN'[]'$      9 Best Language 0'[x]'$  1'[]'$  2'[]'$  3'[]'$      10 Dysarthria 0'[x]'$  1'[]'$  2'[]'$  UN'[]'$      11 Extinct. and Inattention 0'[x]'$  1'[]'$  2'[]'$       TOTAL:  7      History provided by patient.    ROS   Constitutional Denies weight loss, fever and chills.   HEENT Denies changes in vision and hearing.   Respiratory Denies SOB and cough.   CV Denies palpitations and CP   GI Denies abdominal pain, nausea, vomiting and diarrhea.   GU Denies dysuria and urinary frequency.   MSK Denies myalgia and joint pain.   Skin Denies rash and pruritus.   Neurological Denies headache and syncope.   Psychiatric Denies recent changes in mood. Denies anxiety and depression.    Past History  History reviewed. No pertinent past medical history. Past Surgical History:  Procedure Laterality Date   ANKLE FRACTURE SURGERY Right    History reviewed. No pertinent family history. Social History   Socioeconomic History    Marital status: Single    Spouse name: Not on file   Number of children: Not on file   Years of education: Not on file   Highest education level: Not on file  Occupational History   Not on file  Tobacco Use   Smoking status: Never   Smokeless tobacco: Never  Substance and Sexual Activity   Alcohol use: No   Drug use: No   Sexual activity: Not on file  Other Topics Concern   Not on file  Social History Narrative   ** Merged History Encounter **       Social Determinants of Health   Financial Resource Strain: Not on file  Food Insecurity: Not on file  Transportation Needs: Not on file  Physical Activity: Not on file  Stress: Not on file  Social Connections: Not on file   Allergies  Allergen Reactions   Quinine Derivatives Itching    Medications  (Not in a hospital admission)    Vitals   Vitals:   11/02/22 1219 11/02/22 1223 11/02/22 1425  BP: (!) 200/102  (!) 201/102  Pulse: (!) 133  (!) 57  Resp: 17  16  Temp: 97.8 F (36.6 C)    SpO2: 95%  96%  Weight:  90.7 kg   Height:  '5\' 3"'$  (1.6 m)      Body mass index is 35.43 kg/m.  Physical Exam   General: Laying comfortably in bed; in no acute distress.  HENT: Normal oropharynx and mucosa. Normal external appearance of ears and nose.  Neck: Supple, no pain or tenderness  CV: No JVD. No peripheral edema. RR on monitor. Pulmonary: Symmetric Chest rise. Normal respiratory effort. Abdomen: Soft to touch, non-tender.  Ext: No cyanosis, edema, or deformity  Skin: No rash. Normal palpation of skin.  Musculoskeletal: Normal digits and nails by inspection.   Neurologic Examination  Mental status/Cognition:  Aox3, slow to respond on some questions. Follows commands.   Speech/language:  Speech is fluent. No dysarthria. Naming intact.   Cranial nerves:  PERLL, VF full  EOMI, no gaze preference, no nystagmus Facial movements and sensation symmetric Hearing intact to voice Shoulder shrug symmetric Tongue  protrusion midline  Motor:  RUE 5/5, no drift LUE 2/5, able to lift slightly off bed, slight resistance to gravity.  RLE: 4/5, no drift LLE: 0/5, no spontaneous or movement on command Tone and bulk normal.   Sensation: Intact to light touch, symmetric throughout  Coordination/Complex Motor:  - Finger to Nose: intact on the right side. Unable to perform on the left.  - Heel to shin: intact but slow on right side. Unable to perform on the left.  - Rapid alternating movement: intact on right side. Unable to perform on left.  - Gait: deferred  Labs   CBC:  Recent Labs  Lab 11/02/22 1201 11/02/22 1350  WBC 6.3  --   NEUTROABS 4.3  --   HGB 13.6 13.9  HCT 40.3 41.0  MCV 90.2  --   PLT 245  --     Basic Metabolic Panel:  Lab Results  Component Value Date   NA 140 11/02/2022   K 3.5 11/02/2022   CO2 24 11/02/2022   GLUCOSE 144 (H) 11/02/2022   BUN 14 11/02/2022   CREATININE 1.20 11/02/2022   CALCIUM 9.3 11/02/2022   GFRNONAA >60 11/02/2022   GFRAA >60 01/27/2020   Lipid Panel: No results found for: "LDLCALC" HgbA1c: No results found for: "HGBA1C" Urine Drug Screen: No results found for: "LABOPIA", "COCAINSCRNUR", "LABBENZ", "AMPHETMU", "THCU", "LABBARB"  Alcohol Level     Component Value Date/Time   ETH <10 11/02/2022 1201    CT Head without contrast(Personally reviewed): There are no signs of bleeding within the cranium. There is ill-defined area of subtle decreased density in the anteromedial right frontal lobe. Possibility of ischemic change is not excluded. Follow-up MRI may be considered.  CT angio Head and Neck with contrast(Personally reviewed): PENDING Focal occlusion of the right pericallosal artery at the anterior genu, corresponding to the acute/subacute infarct. Moderate stenoses in the distal right P2 segment and superior P3 branches bilaterally. Normal CTA of the neck.  MRI Brain(Personally reviewed): Patchy acute to early subacute infarcts  throughout the right ACA distribution without hemorrhage or mass effect.  MRI C Spine (Personally reviewed):  Multilevel neural foraminal stenosis as above, moderate on the left at C3-C4, moderate to severe on the left and moderate on the right at C4-C5, and severe on the left at C6-C7. No greater than mild spinal canal stenosis.  Impression   Teancum Brule is a 47 y.o. male with  no PMH. His neurologic examination is notable for left hemiplegia.  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   Secondary Diagnosis: Unknown co-morbidities at this time due to patient's lack of medical history.  Hypertensive in ED, unknown if this is his baseline or an acute finding.   Recommendations   - HgbA1c, fasting lipid panel - Frequent neuro checks - Echocardiogram - CTA head and neck - Prophylactic therapy-Antiplatelet med: Aspirin - dose '81mg'$  - Risk factor modification - Telemetry monitoring - PT consult, OT consult, Speech consult - Stroke team to follow

## 2022-11-02 NOTE — Assessment & Plan Note (Deleted)
Trops wnl x2. Echo largely unremarkable w/ normal LVF.

## 2022-11-02 NOTE — H&P (Signed)
Hospital Admission History and Physical Service Pager: [phone]  Patient name: Stetson Pelaez Medical record number: [id number] Date of Birth: 1975-11-23 Age: 47 y.o. Gender: male  Primary Care Provider: Patient, [name] Consultants: Neurology  Code Status: Full  Preferred Emergency Contact: Ronalee Belts (may have different #) Contact Information     Name Relation Home Work [place] Other [phone]     Dadzie,Phillip Relative [phone]          Chief Complaint: Left sided weakness   Assessment and Plan: Saba Neuman is a 47 y.o. male presenting with left sided weakness. Differential for this patient's presentation of this includes embolic stroke secondary to modifiable risk factors high cholesterol, abnormal hearth rhythm, uncontrolled hypertension.    * Stroke [facility]) MRI showing ACA territory stroke.  - Admit to FMTS, attending Dr. Thompson Grayer - Neurology consulted, appreciate recommendations  - med-tele, Vital signs per floor - CTA head and neck pending  - NPO diet, pending SLP eval  - PT/OT to treat - VTE prophylaxis Lovenox  - Fluids: 100 mL/hr maintenance while NPO - AM CBC/BMP  - Fall precautions - lipid panel, A1C ordered  - ASA ordered  - consider adding BP PRN med for SBP >220  Abnormal ECG Cardiology consulted in ED and thought changes were due to LVH.  - trend troponin  - echocardiogram ordered    FEN/GI: NPO pending speech eval  VTE Prophylaxis: Lovenox   Disposition: med-tele   History of Present Illness:  Cleotis Sparr is a 47 y.o. male presenting with subacute left sided weakness that began 3 days ago. Noticed it was weak when he had to pick up his leg to get into his car. He has been needing assistance at home. Denies vision changes, chest pain, SOB. Since symptoms began, has fallen and hit head a couple times, denies LOC. Denies previous strokes or problems with speech. Denies history of high cholesterol or diabetes.   In the ED, code stroke  was called. STAT CT and MRI showed subacute stroke in the ACA territory. Neurology consulted and recommended follow up CTA head and neck to evaluate for carotid stenosis. VS significant for hypertension to 202/102. CBC and CMP WNL. EKG showed ST segment elevation and depression, cardiology reviewed the EKG and said given no CP and stroke like symptoms it is more LHV. They recommended trending troponin and getting an echocardiogram.   Review Of Systems: Per HPI with the following additions: denies chest pain, SOB, abdominal pain   Pertinent Past Medical History: HTN Remainder reviewed in history tab.   Pertinent Past Surgical History: Ankle fracture surgery right   Neck surgery - self reported and unable to give further details  Remainder reviewed in history tab.   Pertinent Social History: Tobacco use: No Alcohol use: Drinks on weekends, 3-4 beers Other Substance use: No Lives with cousins  Pertinent Family History: Brother had stoke and has diabetes Remainder reviewed in history tab.   Important Outpatient Medications: None  Remainder reviewed in medication history.   Objective: BP (!) 201/102   Pulse (!) 57   Temp 97.8 F (36.6 C)   Resp 16   Ht '5\' 3"'$  (1.6 m)   Wt 90.7 kg   SpO2 96%   BMI 35.43 kg/m  Exam: Acutely ill-appearing, no acute distress Cardio: Regular rate, regular rhythm, 3/6 diastolic murmur on exam. Pulm: Clear, no wheezing, no crackles. No increased work of breathing Abdominal: bowel sounds present, soft, non-tender, non-distended Extremities: no  peripheral edema  Neuro: alert and oriented x3, speech delayed but normal in content, no facial asymmetry, strength intact on the right, on the right he can wiggle his toes but can not lift his leg or arm of the bed on the left, pupils equal and reactive to light.   Labs:  CBC BMET  Recent Labs  Lab 11/02/22 1201 11/02/22 1350  WBC 6.3  --   HGB 13.6 13.9  HCT 40.3 41.0  PLT 245  --    Recent Labs   Lab 11/02/22 1201 11/02/22 1350  NA 136 140  K 3.5 3.5  CL 104 103  CO2 24  --   BUN 13 14  CREATININE 1.29* 1.20  GLUCOSE 147* 144*  CALCIUM 9.3  --      EKG: sinus bradycardia, LVH, ST-elevations in lateral leads, ST-depressions II,III, V3-V6   Imaging Studies Performed:  CT Head and Neck WO Contrast:  IMPRESSION: There are no signs of bleeding within the cranium. There is ill-defined area of subtle decreased density in the anteromedial right frontal lobe. Possibility of ischemic change is not excluded. Follow-up MRI may be considered.  CT C-Spine WO Contrast:  IMPRESSION: Multilevel left worse than right uncovertebral ridging in the cervical spine as detailed above resulting in moderate to severe left neural foraminal stenosis at C3-C4, moderate bilateral neural foraminal stenosis at C4-C5, and severe left neural foraminal stenosis at C6-C7.  MRI Brain WO Contrast:  IMPRESSION: Patchy acute to early subacute infarcts throughout the right ACA distribution without hemorrhage or mass effect.  MRI C-Spine WO Contrast:  IMPRESSION: 1. Motion degraded study. 2. Multilevel neural foraminal stenosis as above, moderate on the left at C3-C4, moderate to severe on the left and moderate on the right at C4-C5, and severe on the left at C6-C7. No greater than mild spinal canal stenosis.  CTA Head and Neck Pending   Darci Current, DO 11/02/2022, 5:00 PM PGY-1, [place] Intern pager: [phone], text pages welcome Secure chat group [place]

## 2022-11-02 NOTE — ED Notes (Signed)
Pt transported to MRI 

## 2022-11-02 NOTE — Progress Notes (Incomplete)
{  Select Note:[id number]} ?

## 2022-11-02 NOTE — ED Provider Triage Note (Signed)
Emergency Medicine Provider Triage Evaluation Note  Luis Roth , a 47 y.o. male  was evaluated in triage.  Pt complains of 3 days of unilateral left-sided weakness, inability to move left leg, left arm, patient reports that he has had ongoing weakness since then, it has not subsided it has stayed constant, today attempted to walk and had a bad fall hitting the back of his head.  Patient with history of hypertension, arrives to the emergency department with poorly controlled hypertension, denies any chest pain, shortness of breath, vision changes, confusion.  Patient is alert and oriented x 4.  Review of Systems  Positive: Weakness Negative: Chest pain, numbness  Physical Exam  BP (!) 200/102   Pulse (!) 133   Temp 97.8 F (36.6 C)   Resp 17   Ht '5\' 3"'$  (1.6 m)   Wt 90.7 kg   SpO2 95%   BMI 35.43 kg/m  Gen:   Awake, no distress   Resp:  Normal effort  MSK:   Moves extremities without difficulty  Other:  Patient with significant weakness 3/5 upper left extremity, 2/5 left lower extremity no sensory deficits, CN II through XII grossly intact, he is alert and oriented x 4, gait not assessed 2/2 weakness  Medical Decision Making  Medically screening exam initiated at 12:35 PM.  Appropriate orders placed.  Taygen Acklin was informed that the remainder of the evaluation will be completed by another provider, this initial triage assessment does not replace that evaluation, and the importance of remaining in the ED until their evaluation is complete.  Workup initiated   Anselmo Pickler, [place] 11/02/22 1240

## 2022-11-02 NOTE — ED Provider Notes (Signed)
[facility] EMERGENCY DEPARTMENT Provider Note   CSN: [id number] Arrival date & time: 11/02/22  1201     History  Chief Complaint  Patient presents with   Weakness    Luis Roth is a 47 y.o. male.  Patient with history of hypertension, no active treatment for this, denies other medical conditions --presents to the emergency department today for evaluation of left-sided weakness.  Patient states that symptoms started after he got off of work a couple of days ago.  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  He states that while he was getting into his car he noted that his leg was weak.  He had to "pick up" his leg to get into the car.  Since that time he has had weakness in the left leg and left arm.  He has needed assistance at home.  He states that his cousin drove him to the emergency department today and had to help him get inside.  Patient denies headache, vision change or vision loss, difficulty speaking, facial droop.  No neck pain, recent head or neck injury.  He denies chest pain or shortness of breath.  Patient's never had symptoms like this in the past.  Review of epic shows several ED visits for minor injuries and COVID, he has had documented high blood pressures most recently.       Home Medications Prior to Admission medications   Medication Sig Start Date End Date Taking? Authorizing Provider  ascorbic acid (VITAMIN C) 500 MG tablet Take 500 mg by mouth daily.    [provider]  ibuprofen (ADVIL,MOTRIN) 600 MG tablet Take 1 tablet (600 mg total) by mouth every 6 (six) hours as needed. Patient taking differently: Take 600 mg by mouth every 6 (six) hours as needed for moderate pain.  01/11/14   Noe Gens, PA-C  Multiple Vitamins-Minerals (ZINC PO) Take 1 tablet by mouth in the morning and at bedtime.    [provider]  oxyCODONE-acetaminophen (PERCOCET/ROXICET) 5-325 MG per tablet Take 1-2 pills every 4-6 hours as needed for  pain. Patient not taking: Reported on 01/27/2020 01/11/14   Noe Gens, PA-C  VITAMIN D PO Take 1 tablet by mouth daily.    [provider]      Allergies    Quinine derivatives    Review of Systems   Review of Systems  Physical Exam Updated Vital Signs BP (!) 200/102   Pulse (!) 133   Temp 97.8 F (36.6 C)   Resp 17   Ht '5\' 3"'$  (1.6 m)   Wt 90.7 kg   SpO2 95%   BMI 35.43 kg/m   Physical Exam Vitals and nursing note reviewed.  Constitutional:      Appearance: He is well-developed.  HENT:     Head: Normocephalic and atraumatic.     Right Ear: Tympanic membrane, ear canal and external ear normal.     Left Ear: Tympanic membrane, ear canal and external ear normal.     Nose: Nose normal.     Mouth/Throat:     Mouth: Mucous membranes are moist.     Pharynx: Uvula midline.  Eyes:     General: Lids are normal.     Conjunctiva/sclera: Conjunctivae normal.     Pupils: Pupils are equal, round, and reactive to light.  Neck:     Comments: I do not hear any carotid bruits however exam is limited as patient is in hallway. Cardiovascular:  Rate and Rhythm: Normal rate and regular rhythm.  Pulmonary:     Effort: Pulmonary effort is normal.     Breath sounds: Normal breath sounds.  Abdominal:     Palpations: Abdomen is soft.     Tenderness: There is no abdominal tenderness.  Musculoskeletal:        General: Normal range of motion.     Cervical back: Normal range of motion and neck supple. No tenderness or bony tenderness.  Skin:    General: Skin is warm and dry.  Neurological:     Mental Status: He is alert and oriented to person, place, and time.     GCS: GCS eye subscore is 4. GCS verbal subscore is 5. GCS motor subscore is 6.     Cranial Nerves: No cranial nerve deficit.     Sensory: No sensory deficit.     Motor: Weakness present.     Coordination: Romberg sign positive. Finger-Nose-Finger Test normal.     Gait: Gait normal.     Deep Tendon Reflexes:  Reflexes are normal and symmetric.     Reflex Scores:      Patellar reflexes are 2+ on the right side and 2+ on the left side.    Comments: Patient generalized weakness of the left upper and lower extremity.  Patient has difficulty holding left arm up against gravity.     ED Results / Procedures / Treatments   Labs (all labs ordered are listed, but only abnormal results are displayed) Labs Reviewed  COMPREHENSIVE METABOLIC PANEL - Abnormal; Notable for the following components:      Result Value   Glucose, Bld 147 (*)    Creatinine, Ser 1.29 (*)    All other components within normal limits  I-STAT CHEM 8, ED - Abnormal; Notable for the following components:   Glucose, Bld 144 (*)    All other components within normal limits  ETHANOL  PROTIME-INR  APTT  CBC  DIFFERENTIAL  RAPID URINE DRUG SCREEN, HOSP PERFORMED  URINALYSIS, ROUTINE W REFLEX MICROSCOPIC  RAPID URINE DRUG SCREEN, HOSP PERFORMED  TROPONIN I (HIGH SENSITIVITY)    EKG EKG Interpretation  Date/Time:  Friday November 02 2022 13:53:49 EST Ventricular Rate:  59 PR Interval:  162 QRS Duration: 100 QT Interval:  416 QTC Calculation: 411 R Axis:   82 Text Interpretation: Sinus bradycardia Right atrial enlargement ST/T changes noted, likely LVH, could be ischemia No old tracing to compare Confirmed by Sherwood Gambler [phone]) on 11/02/2022 2:09:52 PM  Radiology CT HEAD WO CONTRAST  Result Date: 11/02/2022 CLINICAL DATA:  Left-sided weakness EXAM: CT HEAD WITHOUT CONTRAST TECHNIQUE: Contiguous axial images were obtained from the base of the skull through the vertex without intravenous contrast. RADIATION DOSE REDUCTION: This exam was performed according to the departmental dose-optimization program which includes automated exposure control, adjustment of the mA and/or kV according to patient size and/or use of iterative reconstruction technique. COMPARISON:  None Available. FINDINGS: Brain: There are no signs of bleeding  within the cranium. Ventricles are not dilated. There is subtle decrease in density in the anteromedial right frontal lobe. No other focal abnormalities are seen. Vascular: Ventricles are not dilated. Skull: No fracture is seen. There is small area of contusion/hematoma in the left posterior parietal cortex. Sinuses/Orbits: There is mucosal thickening in ethmoid and maxillary sinuses. Other: None IMPRESSION: There are no signs of bleeding within the cranium. There is ill-defined area of subtle decreased density in the anteromedial right frontal lobe. Possibility of ischemic  change is not excluded. Follow-up MRI may be considered. Imaging findings were relayed to patient's provider [place] by telephone call. Electronically Signed   By: Elmer Picker M.D.   On: 11/02/2022 13:13   CT CERVICAL SPINE WO CONTRAST  Result Date: 11/02/2022 CLINICAL DATA:  Three days of unilateral left-sided weakness. Unable to move left leg and arm. EXAM: CT CERVICAL SPINE WITHOUT CONTRAST TECHNIQUE: Multidetector CT imaging of the cervical spine was performed without intravenous contrast. Multiplanar CT image reconstructions were also generated. RADIATION DOSE REDUCTION: This exam was performed according to the departmental dose-optimization program which includes automated exposure control, adjustment of the mA and/or kV according to patient size and/or use of iterative reconstruction technique. COMPARISON:  None Available. FINDINGS: Alignment: There is straightening of the normal cervical lordosis. There is no antero or retrolisthesis. There is no evidence of traumatic malalignment. Skull base and vertebrae: Skull base alignment is maintained. Vertebral body heights are preserved. There is no evidence of acute fracture. There is no suspicious osseous lesion. Soft tissues and spinal canal: No prevertebral fluid or swelling. No visible canal hematoma. Disc levels: C2-C3: No significant spinal canal or neural foraminal stenosis  C3-C4: There is left worse than right uncovertebral ridging resulting in moderate to severe left and mild right neural foraminal stenosis without significant spinal canal stenosis C4-C5: There is left worse than right uncovertebral ridging resulting in moderate bilateral neural foraminal stenosis without significant spinal canal stenosis C5-C6: There is mild bilateral uncovertebral ridging without high-grade spinal canal or neural foraminal stenosis. C6-C7: There is prominent left uncovertebral ridging resulting in severe left neural foraminal stenosis. No significant spinal canal or right neural foraminal stenosis C7-T1: No significant spinal canal or neural foraminal stenosis. Upper chest: The imaged lung apices are clear. Other: None. IMPRESSION: Multilevel left worse than right uncovertebral ridging in the cervical spine as detailed above resulting in moderate to severe left neural foraminal stenosis at C3-C4, moderate bilateral neural foraminal stenosis at C4-C5, and severe left neural foraminal stenosis at C6-C7. Electronically Signed   By: Valetta Mole M.D.   On: 11/02/2022 13:08    Procedures Procedures    Medications Ordered in ED Medications - No data to display  ED Course/ Medical Decision Making/ A&P    Patient seen and examined. History obtained directly from patient. Work-up including labs, imaging, EKG ordered in triage, if performed, were reviewed.    Labs/EKG: Independently reviewed and interpreted.  This included: I-STAT Chem-8 glucose 144 otherwise unremarkable; CBC unremarkable; CMP in process; PT/INR normal; APTT normal; ethanol in progress.  Urine drug screen ordered and pending.  Imaging: Independently visualized and interpreted.  This included: CT head, patient with hypodensity in the right frontal area, question infarct.  I was called by radiologist earlier in regards to the read.  Medications/Fluids: None ordered.  Most recent vital signs reviewed and are as follows: BP  (!) 200/102   Pulse (!) 133   Temp 97.8 F (36.6 C)   Resp 17   Ht '5\' 3"'$  (1.6 m)   Wt 90.7 kg   SpO2 95%   BMI 35.43 kg/m   Initial impression: Left-sided upper and lower extremity weakness, concern for stroke.  EKG reviewed with Dr. Regenia Skeeter.  This shows ST segment elevation and depression, again patient denies chest pain. He will talk with cardiology due to appearance of EKG, however exam and history clinically not consistent with STEMI or anginal equivalent.  No bleeding noted on CT head.  Will continue to monitor blood  pressure.  3:25 PM personally reviewed MRI.  I consulted with Dr. Leonel Ramsay of neurology and requested consult.  He has reviewed imaging and suspects ACA territory stroke.  Requests CT angiography of the head and neck to evaluate for carotid occlusion.  Patient will need medical admission.  Also in the interim, Dr. Regenia Skeeter has spoken with cardiology regarding EKG changes.  Likely LVH.  No STEMI.  Recommended echocardiogram troponin.  Please see his note.   Labs personally reviewed and interpreted including: CMP with slightly elevated creatinine otherwise no acute findings; ethanol negative.  Patient still in MRI.  Most current vital signs reviewed and are as follows: BP (!) 201/102   Pulse (!) 57   Temp 97.8 F (36.6 C)   Resp 16   Ht '5\' 3"'$  (1.6 m)   Wt 90.7 kg   SpO2 96%   BMI 35.43 kg/m   Plan: Added on CT head/neck.   3:38 PM consulted with family practice teaching service who will see patient for admission.  Discussed abnormal EKG findings as well.  They are aware.                           Medical Decision Making Amount and/or Complexity of Data Reviewed Labs: ordered. Radiology: ordered.         Final Clinical Impression(s) / ED Diagnoses Final diagnoses:  Acute CVA (cerebrovascular accident) [facility])  Hypertensive urgency  EKG abnormality    Rx / DC Orders ED Discharge Orders     None         Carlisle Cater, PA-C 11/02/22  1538    Sherwood Gambler, MD 11/05/22 1131

## 2022-11-02 NOTE — ED Notes (Signed)
ED TO INPATIENT HANDOFF REPORT  ED Nurse Name and Phone #: Therisa Doyne RN/ [phone]  S Name/Age/Gender Luis Roth 47 y.o. male Room/Bed: 021C/021C  Code Status   Code Status: Full Code  Home/SNF/Other Home Patient oriented to: self, place, time, and situation Is this baseline? Yes   Triage Complete: Triage complete  Chief Complaint Stroke [facility]) [I63.9]  Triage Note Pt arrived POV from home c/o bilateral leg weakness to the point where he cannot stand up. Pt states it started in one leg on Wednesday and now it is both. Pt has unilateral weakness on the left side.    Allergies Allergies  Allergen Reactions   Quinine Derivatives Itching    Level of Care/Admitting Diagnosis ED Disposition     ED Disposition  Admit   Condition  --   Comment  Hospital Area: [place] [[id number]]  Level of Care: Telemetry Medical [104]  May place patient in observation at [facility] or [place] if equivalent level of care is available:: Yes  Covid Evaluation: Asymptomatic - no recent exposure (last 10 days) testing not required  Diagnosis: Stroke [facility]) [270623]  Admitting Physician: Darci Current [[id number]]  Attending Physician: Lenoria Chime [[id number]]          B Medical/Surgery History History reviewed. No pertinent past medical history. Past Surgical History:  Procedure Laterality Date   ANKLE FRACTURE SURGERY Right      A IV Location/Drains/Wounds Patient Lines/Drains/Airways Status     Active Line/Drains/Airways     Name Placement date Placement time Site Days   Peripheral IV 11/02/22 20 G Right Antecubital 11/02/22  1423  Antecubital  less than 1            Intake/Output Last 24 hours No intake or output data in the 24 hours ending 11/02/22 1901  Labs/Imaging Results for orders placed or performed during the hospital encounter of 11/02/22 (from the past 48 hour(s))  Ethanol     Status: None   Collection Time: 11/02/22 12:01 PM  Result  Value Ref Range   Alcohol, Ethyl (B) <10 <10 mg/dL    Comment: (NOTE) Lowest detectable limit for serum alcohol is 10 mg/dL.  For medical purposes only. Performed at [facility], [place] [address] [place], [place] [address]   Protime-INR     Status: None   Collection Time: 11/02/22 12:01 PM  Result Value Ref Range   Prothrombin Time 13.5 11.4 - 15.2 seconds   INR 1.0 0.8 - 1.2    Comment: (NOTE) INR goal varies based on device and disease states. Performed at [facility], [place] [address] [place], [place] [address]   APTT     Status: None   Collection Time: 11/02/22 12:01 PM  Result Value Ref Range   aPTT 31 24 - 36 seconds    Comment: Performed at [place] [address] [place] [address]  CBC     Status: None   Collection Time: 11/02/22 12:01 PM  Result Value Ref Range   WBC 6.3 4.0 - 10.5 K/uL   RBC 4.47 4.22 - 5.81 MIL/uL   Hemoglobin 13.6 13.0 - 17.0 g/dL   HCT 40.3 39.0 - 52.0 %   MCV 90.2 80.0 - 100.0 fL   MCH 30.4 26.0 - 34.0 pg   MCHC 33.7 30.0 - 36.0 g/dL   RDW 13.4 11.5 - 15.5 %   Platelets 245 150 - 400 K/uL   nRBC 0.0 0.0 -  0.2 %    Comment: Performed at [facility], [place] [address], [place], [place] [address]  Differential     Status: None   Collection Time: 11/02/22 12:01 PM  Result Value Ref Range   Neutrophils Relative % 68 %   Neutro Abs 4.3 1.7 - 7.7 K/uL   Lymphocytes Relative 25 %   Lymphs Abs 1.6 0.7 - 4.0 K/uL   Monocytes Relative 6 %   Monocytes Absolute 0.4 0.1 - 1.0 K/uL   Eosinophils Relative 1 %   Eosinophils Absolute 0.1 0.0 - 0.5 K/uL   Basophils Relative 0 %   Basophils Absolute 0.0 0.0 - 0.1 K/uL   Immature Granulocytes 0 %   Abs Immature Granulocytes 0.01 0.00 - 0.07 K/uL    Comment: Performed at [place] [address] [place], [place] [address]  Comprehensive metabolic panel     Status: Abnormal   Collection Time: 11/02/22 12:01 PM  Result Value Ref Range   Sodium 136 135 -  145 mmol/L   Potassium 3.5 3.5 - 5.1 mmol/L   Chloride 104 98 - 111 mmol/L   CO2 24 22 - 32 mmol/L   Glucose, Bld 147 (H) 70 - 99 mg/dL    Comment: Glucose reference range applies only to samples taken after fasting for at least 8 hours.   BUN 13 6 - 20 mg/dL   Creatinine, Ser 1.29 (H) 0.61 - 1.24 mg/dL   Calcium 9.3 8.9 - 10.3 mg/dL   Total Protein 7.5 6.5 - 8.1 g/dL   Albumin 4.0 3.5 - 5.0 g/dL   AST 32 15 - 41 U/L   ALT 27 0 - 44 U/L   Alkaline Phosphatase 76 38 - 126 U/L   Total Bilirubin 0.6 0.3 - 1.2 mg/dL   GFR, Estimated >60 >60 mL/min    Comment: (NOTE) Calculated using the CKD-EPI Creatinine Equation (2021)    Anion gap 8 5 - 15    Comment: Performed at [place] [address], [place], [place] [address]  I-stat chem 8, ED     Status: Abnormal   Collection Time: 11/02/22  1:50 PM  Result Value Ref Range   Sodium 140 135 - 145 mmol/L   Potassium 3.5 3.5 - 5.1 mmol/L   Chloride 103 98 - 111 mmol/L   BUN 14 6 - 20 mg/dL   Creatinine, Ser 1.20 0.61 - 1.24 mg/dL   Glucose, Bld 144 (H) 70 - 99 mg/dL    Comment: Glucose reference range applies only to samples taken after fasting for at least 8 hours.   Calcium, Ion 1.17 1.15 - 1.40 mmol/L   TCO2 26 22 - 32 mmol/L   Hemoglobin 13.9 13.0 - 17.0 g/dL   HCT 41.0 39.0 - 52.0 %  Troponin I (High Sensitivity)     Status: None   Collection Time: 11/02/22  2:19 PM  Result Value Ref Range   Troponin I (High Sensitivity) 15 <18 ng/L    Comment: (NOTE) Elevated high sensitivity troponin I (hsTnI) values and significant  changes across serial measurements may suggest ACS but many other  chronic and acute conditions are known to elevate hsTnI results.  Refer to the "Links" section for chest pain algorithms and additional  guidance. Performed at [facility], [place] [address], [place],  [address]    CT ANGIO HEAD NECK W WO CM  Result Date: 11/02/2022 CLINICAL DATA:  Stroke, follow-up. Bilateral lower  extremity  weakness. MRI positive for right ACA territory infarcts. EXAM: CT ANGIOGRAPHY HEAD AND NECK TECHNIQUE: Multidetector CT imaging of the head and neck was performed using the standard protocol during bolus administration of intravenous contrast. Multiplanar CT image reconstructions and MIPs were obtained to evaluate the vascular anatomy. Carotid stenosis measurements (when applicable) are obtained utilizing NASCET criteria, using the distal internal carotid diameter as the denominator. RADIATION DOSE REDUCTION: This exam was performed according to the departmental dose-optimization program which includes automated exposure control, adjustment of the mA and/or kV according to patient size and/or use of iterative reconstruction technique. CONTRAST:  78m OMNIPAQUE IOHEXOL 350 MG/ML SOLN COMPARISON:  MR head without contrast and CT head without contrast 11/02/2022 FINDINGS: CTA NECK FINDINGS Aortic arch: A 3 vessel arch configuration is present. No focal atherosclerotic change or stenosis is present. Right carotid system: Right common carotid artery is within normal limits. Bifurcation is unremarkable. Cervical right ICA is normal. Left carotid system: The left common carotid artery is within normal limits. Bifurcation is unremarkable. The cervical left ICA is normal. Vertebral arteries: Right vertebral artery is dominant vessel. Both vertebral arteries originate from the subclavian arteries without significant stenoses. No significant stenosis is present in either vertebral artery in the neck. Skeleton: Mild degenerative changes are present cervical spine. No focal osseous lesions are present. Other neck: Soft tissues the neck are otherwise unremarkable. Salivary glands are within normal limits. Thyroid is normal. No significant adenopathy is present. No focal mucosal or submucosal lesions are present. Upper chest: The visualized lung apices are clear. Review of the MIP images confirms the above findings CTA  HEAD FINDINGS Anterior circulation: Minimal calcifications are present within the cavernous internal carotid arteries bilaterally. No significant stenosis is present from the skull base through the ICA termini. The left A1 segment is aplastic. Both A2 segments fill from the right. The M1 segments are normal. The MCA bifurcations are within normal limits bilaterally. MCA branch vessels are normal bilaterally. Focal occlusion is present in the right pericallosal artery at the anterior genu distal to the origin of the callosum marginal vessel. Poor collateralization is present anteriorly, corresponding to the infarct. Left ACA branch vessels are within normal limits. Posterior circulation: Right vertebral artery is the dominant vessel. The PICA origins are visualized and normal. Vertebrobasilar junction and basilar artery are normal. Both posterior cerebral arteries originate from basilar tip. Moderate stenosis is present in the distal right P2 segment. Moderate stenoses are present in the superior P3 branches bilaterally. Venous sinuses: The dural sinuses are patent. The straight sinus and deep cerebral veins are intact. Cortical veins are within normal limits. No significant vascular malformation is evident. Anatomic variants: None Review of the MIP images confirms the above findings IMPRESSION: 1. 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 2. Moderate stenoses in the distal right P2 segment and superior P3 branches bilaterally. 3. Normal CTA of the neck. Electronically Signed   By: CSan MorelleM.D.   On: 11/02/2022 17:18   ECHOCARDIOGRAM COMPLETE  Result Date: 11/02/2022    ECHOCARDIOGRAM REPORT   Patient Name:   JMCADOO MUZQUIZDate of Exam: 11/02/2022 Medical Rec #:  [id number]   Height:       63.0 in Accession #:    [id number]  Weight:       200.0 lb Date of Birth:  111-23-1977  BSA:          1.934 m Patient Age:  46 years      BP:           201/102 mmHg Patient Gender: M            HR:           52 bpm. Exam Location:  Inpatient Procedure: 2D Echo, Cardiac Doppler and Color Doppler Indications:    Stroke I63.9  History:        Patient has no prior history of Echocardiogram examinations.                 Risk Factors:Hypertension and Non-Smoker.  Sonographer:    Greer Pickerel Referring Phys: [place]  Sonographer Comments: Image acquisition challenging due to respiratory motion. IMPRESSIONS  1. Left ventricular ejection fraction, by estimation, is 65 to 70%. The left ventricle has normal function. The left ventricle has no regional wall motion abnormalities. Left ventricular diastolic parameters are consistent with Grade I diastolic dysfunction (impaired relaxation).  2. Right ventricular systolic function is normal. The right ventricular size is normal. There is normal pulmonary artery systolic pressure. The estimated right ventricular systolic pressure is 9.7 mmHg.  3. Left atrial size was mildly dilated.  4. The mitral valve is grossly normal. No evidence of mitral valve regurgitation. No evidence of mitral stenosis.  5. The aortic valve is tricuspid. Aortic valve regurgitation is not visualized. No aortic stenosis is present.  6. The inferior vena cava is normal in size with greater than 50% respiratory variability, suggesting right atrial pressure of 3 mmHg. Conclusion(s)/Recommendation(s): No intracardiac source of embolism detected on this transthoracic study. Consider a transesophageal echocardiogram to exclude cardiac source of embolism if clinically indicated. FINDINGS  Left Ventricle: Left ventricular ejection fraction, by estimation, is 65 to 70%. The left ventricle has normal function. The left ventricle has no regional wall motion abnormalities. The left ventricular internal cavity size was normal in size. There is  no left ventricular hypertrophy. Left ventricular diastolic parameters are consistent with Grade I  diastolic dysfunction (impaired relaxation). Right Ventricle: The right ventricular size is normal. No increase in right ventricular wall thickness. Right ventricular systolic function is normal. There is normal pulmonary artery systolic pressure. The tricuspid regurgitant velocity is 1.29 m/s, and  with an assumed right atrial pressure of 3 mmHg, the estimated right ventricular systolic pressure is 9.7 mmHg. Left Atrium: Left atrial size was mildly dilated. Right Atrium: Right atrial size was normal in size. Pericardium: There is no evidence of pericardial effusion. Mitral Valve: The mitral valve is grossly normal. No evidence of mitral valve regurgitation. No evidence of mitral valve stenosis. Tricuspid Valve: The tricuspid valve is grossly normal. Tricuspid valve regurgitation is trivial. No evidence of tricuspid stenosis. Aortic Valve: The aortic valve is tricuspid. Aortic valve regurgitation is not visualized. No aortic stenosis is present. Pulmonic Valve: The pulmonic valve was grossly normal. Pulmonic valve regurgitation is trivial. No evidence of pulmonic stenosis. Aorta: The aortic root and ascending aorta are structurally normal, with no evidence of dilitation. Venous: The inferior vena cava is normal in size with greater than 50% respiratory variability, suggesting right atrial pressure of 3 mmHg. IAS/Shunts: The atrial septum is grossly normal.  LEFT VENTRICLE PLAX 2D LVIDd:         4.70 cm   Diastology LVIDs:         2.90 cm   LV e' medial:    6.06 cm/s LV PW:         1.10 cm   LV  E/e' medial:  11.5 LV IVS:        1.00 cm   LV e' lateral:   8.70 cm/s LVOT diam:     2.00 cm   LV E/e' lateral: 8.0 LV SV:         79 LV SV Index:   41 LVOT Area:     3.14 cm  RIGHT VENTRICLE RV S prime:     14.10 cm/s TAPSE (M-mode): 2.6 cm LEFT ATRIUM             Index        RIGHT ATRIUM           Index LA diam:        3.90 cm 2.02 cm/m   RA Area:     26.80 cm LA Vol (A2C):   55.9 ml 28.91 ml/m  RA Volume:   84.90 ml   43.91 ml/m LA Vol (A4C):   80.5 ml 41.63 ml/m LA Biplane Vol: 71.9 ml 37.18 ml/m  AORTIC VALVE             PULMONIC VALVE LVOT Vmax:   110.00 cm/s PR End Diast Vel: 3.02 msec LVOT Vmean:  69.800 cm/s LVOT VTI:    0.252 m  AORTA Ao Root diam: 3.30 cm Ao Asc diam:  3.80 cm MITRAL VALVE               TRICUSPID VALVE MV Area (PHT): 3.42 cm    TR Peak grad:   6.7 mmHg MV Decel Time: 222 msec    TR Vmax:        129.00 cm/s MV E velocity: 69.40 cm/s MV A velocity: 73.70 cm/s  SHUNTS MV E/A ratio:  0.94        Systemic VTI:  0.25 m                            Systemic Diam: 2.00 cm Eleonore Chiquito MD Electronically signed by Eleonore Chiquito MD Signature Date/Time: 11/02/2022/5:05:02 PM    Final    MR Cervical Spine Wo Contrast  Result Date: 11/02/2022 CLINICAL DATA:  Inability to use left arm arm and leg. EXAM: MRI CERVICAL SPINE WITHOUT CONTRAST TECHNIQUE: Multiplanar, multisequence MR imaging of the cervical spine was performed. No intravenous contrast was administered. COMPARISON:  Same-day CT cervical spine. FINDINGS: Image quality is moderately motion degraded, limiting evaluation of neural foraminal stenosis particularly. Alignment: There is straightening of the normal cervical curvature. Vertebrae: Vertebral body heights are preserved. Background marrow signal is normal. There is no suspicious marrow signal abnormality or marrow edema. Cord: Normal in signal and morphology. Posterior Fossa, vertebral arteries, paraspinal tissues: The posterior fossa is assessed on the separately dictated brain MRI. The vertebral artery flow voids are normal. There is lipoma in the right posterior neck measuring up to 2.3 cm transverse (15-5). The paraspinal soft tissues are otherwise unremarkable. Disc levels: C2-C3: No significant spinal canal or neural foraminal stenosis C3-C4: There is left worse than right uncovertebral spurring resulting in moderate left and no significant right neural foraminal stenosis and no significant  spinal canal stenosis C4-C5: There is a left paracentral broad-based posterior disc osteophyte complex and bilateral uncovertebral spurring resulting in mild spinal canal stenosis and moderate to severe left and moderate right neural foraminal stenosis C5-C6: There is mild left worse than right uncovertebral ridging resulting in mild left and no significant right neural foraminal stenosis and no significant spinal canal stenosis  C6-C7: There is prominent left uncovertebral ridging resulting in severe left neural foraminal stenosis. No significant spinal canal or right neural foraminal stenosis. C7-T1: No significant spinal canal or neural foraminal stenosis. IMPRESSION: 1. Motion degraded study. 2. Multilevel neural foraminal stenosis as above, moderate on the left at C3-C4, moderate to severe on the left and moderate on the right at C4-C5, and severe on the left at C6-C7. No greater than mild spinal canal stenosis. Electronically Signed   By: Valetta Mole M.D.   On: 11/02/2022 15:45   MR BRAIN WO CONTRAST  Result Date: 11/02/2022 CLINICAL DATA:  Three days of unilateral left-sided weakness. Inability to move left leg and arm. EXAM: MRI HEAD WITHOUT CONTRAST TECHNIQUE: Multiplanar, multiecho pulse sequences of the brain and surrounding structures were obtained without intravenous contrast. COMPARISON:  Same-day CT head. FINDINGS: Brain: There is patchy diffusion restriction with associated FLAIR signal abnormality in the right frontal lobe throughout the ACA distribution consistent with acute to early subacute infarcts. There is involvement of the right aspect of the corpus callosum. There is no associated hemorrhage or mass effect. There is no acute intracranial hemorrhage or extra-axial fluid collection. Background parenchymal volume is normal. The ventricles are normal in size. There is mild FLAIR signal abnormality in the remainder of the subcortical and periventricular white matter likely reflecting mild  background chronic small-vessel ischemic change. The pituitary and suprasellar region are normal. There is no mass lesion. There is no mass effect or midline shift. Vascular: Normal flow voids. Skull and upper cervical spine: Normal marrow signal. Sinuses/Orbits: There is mucosal thickening in the left maxillary sinus. The globes and orbits are unremarkable. IMPRESSION: Patchy acute to early subacute infarcts throughout the right ACA distribution without hemorrhage or mass effect. Electronically Signed   By: Valetta Mole M.D.   On: 11/02/2022 15:32   CT HEAD WO CONTRAST  Result Date: 11/02/2022 CLINICAL DATA:  Left-sided weakness EXAM: CT HEAD WITHOUT CONTRAST TECHNIQUE: Contiguous axial images were obtained from the base of the skull through the vertex without intravenous contrast. RADIATION DOSE REDUCTION: This exam was performed according to the departmental dose-optimization program which includes automated exposure control, adjustment of the mA and/or kV according to patient size and/or use of iterative reconstruction technique. COMPARISON:  None Available. FINDINGS: Brain: There are no signs of bleeding within the cranium. Ventricles are not dilated. There is subtle decrease in density in the anteromedial right frontal lobe. No other focal abnormalities are seen. Vascular: Ventricles are not dilated. Skull: No fracture is seen. There is small area of contusion/hematoma in the left posterior parietal cortex. Sinuses/Orbits: There is mucosal thickening in ethmoid and maxillary sinuses. Other: None IMPRESSION: There are no signs of bleeding within the cranium. There is ill-defined area of subtle decreased density in the anteromedial right frontal lobe. Possibility of ischemic change is not excluded. Follow-up MRI may be considered. Imaging findings were relayed to patient's provider [place] by telephone call. Electronically Signed   By: Elmer Picker M.D.   On: 11/02/2022 13:13   CT CERVICAL SPINE WO  CONTRAST  Result Date: 11/02/2022 CLINICAL DATA:  Three days of unilateral left-sided weakness. Unable to move left leg and arm. EXAM: CT CERVICAL SPINE WITHOUT CONTRAST TECHNIQUE: Multidetector CT imaging of the cervical spine was performed without intravenous contrast. Multiplanar CT image reconstructions were also generated. RADIATION DOSE REDUCTION: This exam was performed according to the departmental dose-optimization program which includes automated exposure control, adjustment of the mA and/or kV according to patient  size and/or use of iterative reconstruction technique. COMPARISON:  None Available. FINDINGS: Alignment: There is straightening of the normal cervical lordosis. There is no antero or retrolisthesis. There is no evidence of traumatic malalignment. Skull base and vertebrae: Skull base alignment is maintained. Vertebral body heights are preserved. There is no evidence of acute fracture. There is no suspicious osseous lesion. Soft tissues and spinal canal: No prevertebral fluid or swelling. No visible canal hematoma. Disc levels: C2-C3: No significant spinal canal or neural foraminal stenosis C3-C4: There is left worse than right uncovertebral ridging resulting in moderate to severe left and mild right neural foraminal stenosis without significant spinal canal stenosis C4-C5: There is left worse than right uncovertebral ridging resulting in moderate bilateral neural foraminal stenosis without significant spinal canal stenosis C5-C6: There is mild bilateral uncovertebral ridging without high-grade spinal canal or neural foraminal stenosis. C6-C7: There is prominent left uncovertebral ridging resulting in severe left neural foraminal stenosis. No significant spinal canal or right neural foraminal stenosis C7-T1: No significant spinal canal or neural foraminal stenosis. Upper chest: The imaged lung apices are clear. Other: None. IMPRESSION: Multilevel left worse than right uncovertebral ridging in  the cervical spine as detailed above resulting in moderate to severe left neural foraminal stenosis at C3-C4, moderate bilateral neural foraminal stenosis at C4-C5, and severe left neural foraminal stenosis at C6-C7. Electronically Signed   By: Valetta Mole M.D.   On: 11/02/2022 13:08    Pending Labs Unresulted Labs (From admission, onward)     Start     Ordered   11/03/22 9417  Basic metabolic panel  Tomorrow morning,   R        11/02/22 1539   11/03/22 0500  CBC  Tomorrow morning,   R        11/02/22 1539   11/03/22 0500  Lipid panel  Tomorrow morning,   R        11/02/22 1553   11/03/22 0500  Hemoglobin A1c  Tomorrow morning,   R        11/02/22 1553   11/02/22 1537  HIV Antibody (routine testing w rflx)  (HIV Antibody (Routine testing w reflex) panel)  Once,   R        11/02/22 1539   11/02/22 1400  Rapid urine drug screen (hospital performed)  ONCE - STAT,   STAT        11/02/22 1400   11/02/22 1235  Urine rapid drug screen (hosp performed)  Once,   STAT        11/02/22 1235   11/02/22 1235  Urinalysis, Routine w reflex microscopic Urine, Clean Catch  Once,   URGENT        11/02/22 1235            Vitals/Pain Today's Vitals   11/02/22 1219 11/02/22 1223 11/02/22 1425 11/02/22 1755  BP: (!) 200/102  (!) 201/102   Pulse: (!) 133  (!) 57   Resp: 17  16   Temp: 97.8 F (36.6 C)   (!) 97.4 F (36.3 C)  TempSrc:    Oral  SpO2: 95%  96%   Weight:  90.7 kg    Height:  '5\' 3"'$  (1.6 m)    PainSc:  0-No pain      Isolation Precautions No active isolations  Medications Medications  acetaminophen (TYLENOL) tablet 650 mg (has no administration in time range)    Or  acetaminophen (TYLENOL) suppository 650 mg (has no administration in time range)  polyethylene glycol (MIRALAX / GLYCOLAX) packet 17 g (has no administration in time range)  ondansetron (ZOFRAN) tablet 4 mg (has no administration in time range)    Or  ondansetron (ZOFRAN) injection 4 mg (has no administration  in time range)  lactated ringers infusion ( Intravenous New Bag/Given 11/02/22 1757)  aspirin EC tablet 81 mg (81 mg Oral Given 11/02/22 1752)  iohexol (OMNIPAQUE) 350 MG/ML injection 75 mL (75 mLs Intravenous Contrast Given 11/02/22 1706)    Mobility Pt came in for weakness in the legs so I haven't gotten him out of bed High fall risk   Focused Assessments     R Recommendations: See Admitting Provider Note  Report given to:   Additional Notes: Pt originally came in for weakness in the legs. He is weak on the whole left side. He is A&Ox 4 and uses a urinal at bedside even though he might need assistance. He has a 20 G in the R [facility]. He has a friend at bedside.

## 2022-11-02 NOTE — Hospital Course (Addendum)
Luis Roth is a 47 y.o. male presenting with subacute ACA territory stroke. PMHx includes HTN. His hospital course is outlined below:   Subacute Stroke:  Patient presented to the emergency room s/p 2-3 days onset of acute left sided weakness. In the ED, code stroke was called. VS significant for hypertension to 202/102. STAT CT and MRI showed subacute stroke in the ACA territory. Follow up CTA head and neck were negative for stenosis. CBC and CMP WNL. Lipid panel showed LDL of 142, 80 mg of Atorvastatin was started. Neurology consulted and recommended 3 weeks of Plavix and ASA followed by just ASA. Neurology also ordered antithrombin III, Protein C, Protein S, lupus anticoagulant, cardiolipin antibodies, ANA which resulted within normal limits.  Beta-2-glycoprotein, and factor 5 leiden pending results. PT and OT evaluated the patient and recommended SNF placement due to poor support at home to continue rehab.   ECG Changes:  On admission his EKG showed ST segment elevation and depression, cardiology reviewed the EKG and said given no CP and stroke like symptoms it is more LHV. Troponin remained flat. His echocardiogram showed left atrial dilation. Neurology recommended a follow up TEE which showed normal LVEF, severe LVH and no LA appendage clot. Neurology recommended a follow up 30 day cardiac monitor to evaluate for abnormal heart rhythm in contribution to this stroke. Seen by cardiology who noted they will facilitate this.   PCP Follow Up:  Check BMP outpatient in 1 week to monitor electrolytes since starting [facility] inpatient.  Follow-up on beta-2 glycoprotein, factor V Leiden and prothrombin gene mutation labs. Follow-up 30-day cardiac monitoring.  CT Head and Neck WO Contrast:  IMPRESSION: There are no signs of bleeding within the cranium. There is ill-defined area of subtle decreased density in the anteromedial right frontal lobe. Possibility of ischemic change is not excluded. Follow-up MRI  may be considered.  CT C-Spine WO Contrast:  IMPRESSION: Multilevel left worse than right uncovertebral ridging in the cervical spine as detailed above resulting in moderate to severe left neural foraminal stenosis at C3-C4, moderate bilateral neural foraminal stenosis at C4-C5, and severe left neural foraminal stenosis at C6-C7.  MRI Brain WO Contrast:  IMPRESSION: Patchy acute to early subacute infarcts throughout the right ACA distribution without hemorrhage or mass effect.  MRI C-Spine WO Contrast:  IMPRESSION: 1. Motion degraded study. 2. Multilevel neural foraminal stenosis as above, moderate on the left at C3-C4, moderate to severe on the left and moderate on the right at C4-C5, and severe on the left at C6-C7. No greater than mild spinal canal stenosis.  CTA Head and Neck: IMPRESSION: 1. 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 2. Moderate stenoses in the distal right P2 segment and superior P3 branches bilaterally. 3. Normal CTA of the neck.

## 2022-11-02 NOTE — ED Triage Notes (Addendum)
Pt arrived POV from home c/o bilateral leg weakness to the point where he cannot stand up. Pt states it started in one leg on Wednesday and now it is both. Pt has unilateral weakness on the left side.

## 2022-11-03 DIAGNOSIS — E785 Hyperlipidemia, unspecified: Secondary | ICD-10-CM | POA: Diagnosis present

## 2022-11-03 DIAGNOSIS — Z888 Allergy status to other drugs, medicaments and biological substances status: Secondary | ICD-10-CM | POA: Diagnosis not present

## 2022-11-03 DIAGNOSIS — G8194 Hemiplegia, unspecified affecting left nondominant side: Secondary | ICD-10-CM | POA: Diagnosis present

## 2022-11-03 DIAGNOSIS — I63421 Cerebral infarction due to embolism of right anterior cerebral artery: Secondary | ICD-10-CM | POA: Diagnosis not present

## 2022-11-03 DIAGNOSIS — I63521 Cerebral infarction due to unspecified occlusion or stenosis of right anterior cerebral artery: Secondary | ICD-10-CM | POA: Diagnosis present

## 2022-11-03 DIAGNOSIS — I10 Essential (primary) hypertension: Secondary | ICD-10-CM | POA: Diagnosis present

## 2022-11-03 DIAGNOSIS — I639 Cerebral infarction, unspecified: Secondary | ICD-10-CM | POA: Diagnosis present

## 2022-11-03 DIAGNOSIS — I6339 Cerebral infarction due to thrombosis of other cerebral artery: Secondary | ICD-10-CM | POA: Diagnosis not present

## 2022-11-03 DIAGNOSIS — R4189 Other symptoms and signs involving cognitive functions and awareness: Secondary | ICD-10-CM | POA: Diagnosis present

## 2022-11-03 DIAGNOSIS — Z6835 Body mass index (BMI) 35.0-35.9, adult: Secondary | ICD-10-CM | POA: Diagnosis not present

## 2022-11-03 DIAGNOSIS — Z823 Family history of stroke: Secondary | ICD-10-CM | POA: Diagnosis not present

## 2022-11-03 DIAGNOSIS — I160 Hypertensive urgency: Secondary | ICD-10-CM | POA: Diagnosis present

## 2022-11-03 DIAGNOSIS — E782 Mixed hyperlipidemia: Secondary | ICD-10-CM | POA: Diagnosis not present

## 2022-11-03 DIAGNOSIS — R9431 Abnormal electrocardiogram [ECG] [EKG]: Secondary | ICD-10-CM | POA: Diagnosis present

## 2022-11-03 DIAGNOSIS — M4802 Spinal stenosis, cervical region: Secondary | ICD-10-CM | POA: Diagnosis present

## 2022-11-03 DIAGNOSIS — I517 Cardiomegaly: Secondary | ICD-10-CM | POA: Diagnosis not present

## 2022-11-03 DIAGNOSIS — I340 Nonrheumatic mitral (valve) insufficiency: Secondary | ICD-10-CM | POA: Diagnosis not present

## 2022-11-03 DIAGNOSIS — Z79899 Other long term (current) drug therapy: Secondary | ICD-10-CM | POA: Diagnosis not present

## 2022-11-03 DIAGNOSIS — I6300 Cerebral infarction due to thrombosis of unspecified precerebral artery: Secondary | ICD-10-CM | POA: Diagnosis not present

## 2022-11-03 DIAGNOSIS — E669 Obesity, unspecified: Secondary | ICD-10-CM | POA: Diagnosis present

## 2022-11-03 DIAGNOSIS — R29707 NIHSS score 7: Secondary | ICD-10-CM | POA: Diagnosis present

## 2022-11-03 LAB — CBC
HCT: 39.1 % (ref 39.0–52.0)
Hemoglobin: 13 g/dL (ref 13.0–17.0)
MCH: 30 pg (ref 26.0–34.0)
MCHC: 33.2 g/dL (ref 30.0–36.0)
MCV: 90.1 fL (ref 80.0–100.0)
Platelets: 218 10*3/uL (ref 150–400)
RBC: 4.34 MIL/uL (ref 4.22–5.81)
RDW: 13.6 % (ref 11.5–15.5)
WBC: 5 10*3/uL (ref 4.0–10.5)
nRBC: 0 % (ref 0.0–0.2)

## 2022-11-03 LAB — BASIC METABOLIC PANEL
Anion gap: 10 (ref 5–15)
BUN: 9 mg/dL (ref 6–20)
CO2: 23 mmol/L (ref 22–32)
Calcium: 9.1 mg/dL (ref 8.9–10.3)
Chloride: 104 mmol/L (ref 98–111)
Creatinine, Ser: 1.13 mg/dL (ref 0.61–1.24)
GFR, Estimated: >60 mL/min (ref >60)
Glucose, Bld: 107 mg/dL — ABNORMAL HIGH (ref 70–99)
Potassium: 3.5 mmol/L (ref 3.5–5.1)
Sodium: 137 mmol/L (ref 135–145)

## 2022-11-03 LAB — HIV ANTIBODY (ROUTINE TESTING W REFLEX): HIV Screen 4th Generation wRfx: NONREACTIVE

## 2022-11-03 LAB — LIPID PANEL
Cholesterol: 211 mg/dL — ABNORMAL HIGH (ref 0–200)
HDL: 51 mg/dL (ref >40)
LDL Cholesterol: 142 mg/dL — ABNORMAL HIGH (ref 0–99)
Total CHOL/HDL Ratio: 4.1 RATIO
Triglycerides: 92 mg/dL (ref <150)
VLDL: 18 mg/dL (ref 0–40)

## 2022-11-03 LAB — HEMOGLOBIN A1C
Hgb A1c MFr Bld: 5.3 % (ref 4.8–5.6)
Mean Plasma Glucose: 105.41 mg/dL

## 2022-11-03 LAB — RPR: RPR Ser Ql: NONREACTIVE

## 2022-11-03 LAB — ANTITHROMBIN III: AntiThromb III Func: 96 % (ref 75–120)

## 2022-11-03 MED ORDER — ATORVASTATIN CALCIUM 40 MG PO TABS
40.0000 mg | ORAL_TABLET | Freq: Every day | ORAL | Status: DC
  Administered 2022-11-04 – 2022-11-05 (×2): 40 mg via ORAL
  Filled 2022-11-03 (×2): qty 1

## 2022-11-03 MED ORDER — CLOPIDOGREL BISULFATE 75 MG PO TABS
75.0000 mg | ORAL_TABLET | Freq: Every day | ORAL | Status: DC
  Administered 2022-11-03 – 2022-11-12 (×10): 75 mg via ORAL
  Filled 2022-11-03 (×10): qty 1

## 2022-11-03 NOTE — Assessment & Plan Note (Signed)
LDL elevated. - Start rosuvastatin '20mg'$  daily

## 2022-11-03 NOTE — Plan of Care (Signed)

## 2022-11-03 NOTE — Evaluation (Signed)
Physical Therapy Evaluation Patient Details Name: Luis Roth MRN: [id number] DOB: 02-28-1976 Today's Date: 11/03/2022  History of Present Illness  Pt is a 47 y/o M presenting to ED on 1/12 with BLE weakness, L >R. MRI revealing R ACA territory CVA. 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 PMH includes HTN.  Clinical Impression  Pt admitted with/for R ACA stroke.  Pt needing mod to max assist for most mobility with weak and incoordinated L UE and significantly limited L LE.Marland Kitchen  Pt currently limited functionally due to the problems listed. ( See problems list.)   Pt will benefit from PT to maximize function and safety in order to get ready for next venue listed below.        Recommendations for follow up therapy are one component of a multi-disciplinary discharge planning process, led by the attending physician.  Recommendations may be updated based on patient status, additional functional criteria and insurance authorization.  Follow Up Recommendations Acute inpatient rehab (3hours/day)      Assistance Recommended at Discharge Frequent or constant Supervision/Assistance  Patient can return home with the following  A lot of help with walking and/or transfers;A lot of help with bathing/dressing/bathroom;Assistance with cooking/housework;Assist for transportation;Help with stairs or ramp for entrance    Equipment Recommendations Other (comment) (TBD)  Recommendations for Other Services  Rehab consult    Functional Status Assessment Patient has had a recent decline in their functional status and demonstrates the ability to make significant improvements in function in a reasonable and predictable amount of time.     Precautions / Restrictions Precautions Precautions: Fall Precaution Comments: L hemi Restrictions Weight Bearing Restrictions: No      Mobility  Bed Mobility Overal bed mobility: Needs  Assistance Bed Mobility: Rolling, Supine to Sit Rolling: Max assist, +2 for physical assistance, Mod assist (mod to L, Max to R)   Supine to sit: Min assist, +2 for physical assistance          Transfers Overall transfer level: Needs assistance   Transfers: Sit to/from Stand, Bed to chair/wheelchair/BSC Sit to Stand: Mod assist, Max assist, +2 physical assistance   Step pivot transfers: Mod assist, Max assist, +2 physical assistance       General transfer comment: pt able to assist control of L knee, but still needed blocked at times.    Ambulation/Gait               General Gait Details: pivot steps to chair only  Stairs            Wheelchair Mobility    Modified Rankin (Stroke Patients Only) Modified Rankin (Stroke Patients Only) Pre-Morbid Rankin Score: No symptoms Modified Rankin: Severe disability     Balance Overall balance assessment: Needs assistance Sitting-balance support: Feet supported Sitting balance-Leahy Scale: Fair     Standing balance support: During functional activity Standing balance-Leahy Scale: Poor Standing balance comment: reliant on external support                             Pertinent Vitals/Pain Pain Assessment Pain Assessment: No/denies pain    Home Living Family/patient expects to be discharged to:: Private residence Living Arrangements: Other relatives (cousin) Available Help at Discharge: Family;Available PRN/intermittently Type of Home: Apartment Home Access: Stairs to enter   Entrance Stairs-Number of Steps: flight   Home Layout: One level Home Equipment: None      Prior  Function Prior Level of Function : Independent/Modified Independent             Mobility Comments: no AD use ADLs Comments: works for [employer], [place]     [employer]   Dominant Hand:  (reports ambidextrous)    Extremity/Trunk Assessment   Upper Extremity Assessment Upper Extremity Assessment: LUE  deficits/detail LUE Deficits / Details: unable to shrug shoulders, LUE Coordination: decreased fine motor;decreased gross motor    Lower Extremity Assessment Lower Extremity Assessment: RLE deficits/detail;LLE deficits/detail RLE Deficits / Details: WFL LLE Deficits / Details: no spontaneous movement noted or to command (in supine) during MMT LLE Coordination: decreased gross motor;decreased fine motor    Cervical / Trunk Assessment Cervical / Trunk Assessment: Normal  Communication   Communication: Other (comment) (delayed responses)  Cognition Arousal/Alertness: Awake/alert Behavior During Therapy: Flat affect Overall Cognitive Status: Impaired/Different from baseline Area of Impairment: Attention, Memory, Following commands, Safety/judgement, Awareness, Problem solving                   Current Attention Level: Focused Memory: Decreased short-term memory Following Commands: Follows one step commands with increased time Safety/Judgement: Decreased awareness of safety, Decreased awareness of deficits Awareness: Emergent Problem Solving: Slow processing General Comments: increased cues for safety/sequencing with ADL and mobility tasks        General Comments General comments (skin integrity, edema, etc.): vss    Exercises     Assessment/Plan    PT Assessment Patient needs continued PT services  PT Problem List Decreased strength;Decreased activity tolerance;Decreased balance;Decreased mobility;Decreased coordination;Impaired sensation       PT Treatment Interventions DME instruction;Gait training;Functional mobility training;Therapeutic activities;Balance training;Neuromuscular re-education;Patient/family education    PT Goals (Current goals can be found in the Care Plan section)  Acute Rehab PT Goals Patient Stated Goal: back to PLOF PT Goal Formulation: With patient Time For Goal Achievement: 11/17/22 Potential to Achieve Goals: Good    Frequency Min  4X/week     Co-evaluation PT/OT/SLP Co-Evaluation/Treatment: Yes Reason for Co-Treatment: Complexity of the patient's impairments (multi-system involvement) PT goals addressed during session: Mobility/safety with mobility OT goals addressed during session: ADL's and self-care       AM-PAC PT "6 Clicks" Mobility  Outcome Measure Help needed turning from your back to your side while in a flat bed without using bedrails?: Total Help needed moving from lying on your back to sitting on the side of a flat bed without using bedrails?: Total Help needed moving to and from a bed to a chair (including a wheelchair)?: Total Help needed standing up from a chair using your arms (e.g., wheelchair or bedside chair)?: Total Help needed to walk in hospital room?: Total Help needed climbing 3-5 steps with a railing? : Total 6 Click Score: 6    End of Session   Activity Tolerance: Patient tolerated treatment well;Patient limited by fatigue Patient left: in chair;with call bell/phone within reach;with chair alarm set Nurse Communication: Mobility status PT Visit Diagnosis: Unsteadiness on feet (R26.81);Hemiplegia and hemiparesis;Other abnormalities of gait and mobility (R26.89) Hemiplegia - Right/Left: Left Hemiplegia - dominant/non-dominant: Non-dominant Hemiplegia - caused by: Cerebral infarction    Time: 1231-1300 PT Time Calculation (min) (ACUTE ONLY): 29 min   Charges:   PT Evaluation $PT Eval Moderate Complexity: 1 Mod          11/03/2022  Ginger Carne., PT Acute Rehabilitation Services [phone]  (office)  Tessie Fass Liv Rallis 11/03/2022, 2:57 PM

## 2022-11-03 NOTE — Evaluation (Signed)
Occupational Therapy Evaluation Patient Details Name: Luis Roth MRN: [id number] DOB: August 31, 1976 Today's Date: 11/03/2022   History of Present Illness Pt is a 47 y/o M presenting to ED on 1/12 with BLE weakness, L >R. MRI revealing R ACA territory CVA. 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 PMH includes HTN.   Clinical Impression   Pt reports independence at baseline with ADLs and functional mobility, lives with cousin in second floor apartment. Pt currently presenting with L weakness and inattention, needing min-mod A for ADLs, min A +2 for bed mobility, and mod-max A +2 for step pivot transfer to chair. Pt presenting with impairments listed below, will follow acutely. Recommend AIR at d/c.      Recommendations for follow up therapy are one component of a multi-disciplinary discharge planning process, led by the attending physician.  Recommendations may be updated based on patient status, additional functional criteria and insurance authorization.   Follow Up Recommendations  Acute inpatient rehab (3hours/day)     Assistance Recommended at Discharge Frequent or constant Supervision/Assistance  Patient can return home with the following A lot of help with walking and/or transfers;A lot of help with bathing/dressing/bathroom;Assistance with cooking/housework;Direct supervision/assist for medications management;Direct supervision/assist for financial management;Assist for transportation;Help with stairs or ramp for entrance    Functional Status Assessment  Patient has had a recent decline in their functional status and demonstrates the ability to make significant improvements in function in a reasonable and predictable amount of time.  Equipment Recommendations  Other (comment) (defer)    Recommendations for Other Services PT consult;Rehab consult     Precautions / Restrictions Precautions Precautions:  Fall Precaution Comments: L hemi Restrictions Weight Bearing Restrictions: No      Mobility Bed Mobility Overal bed mobility: Needs Assistance Bed Mobility: Supine to Sit     Supine to sit: Min assist, +2 for physical assistance          Transfers Overall transfer level: Needs assistance Equipment used: 2 person hand held assist Transfers: Sit to/from Stand, Bed to chair/wheelchair/BSC Sit to Stand: Mod assist, Max assist, +2 physical assistance     Step pivot transfers: Mod assist, Max assist, +2 physical assistance            Balance Overall balance assessment: Needs assistance Sitting-balance support: Feet supported Sitting balance-Leahy Scale: Fair     Standing balance support: During functional activity Standing balance-Leahy Scale: Poor Standing balance comment: reliant on external support                           ADL either performed or assessed with clinical judgement   ADL Overall ADL's : Needs assistance/impaired Eating/Feeding: Supervision/ safety   Grooming: Minimal assistance   Upper Body Bathing: Moderate assistance   Lower Body Bathing: Moderate assistance   Upper Body Dressing : Moderate assistance   Lower Body Dressing: Moderate assistance   Toilet Transfer: Moderate assistance;Maximal assistance;+2 for physical assistance   Toileting- Clothing Manipulation and Hygiene: Moderate assistance       Functional mobility during ADLs: Moderate assistance;+2 for physical assistance       Vision   Additional Comments: able to identify numbers held up in different quadrants of visual field, L inattention noted     Perception Perception Perception Tested?: No   Praxis Praxis Praxis tested?: Not tested    Pertinent Vitals/Pain Pain Assessment Pain Assessment: No/denies pain     Hand  Dominance  (reports ambidextrous)   Extremity/Trunk Assessment Upper Extremity Assessment Upper Extremity Assessment: LUE  deficits/detail LUE Deficits / Details: grossly 2/5, unable to shrug shoulders, increased movement/strength distally vs proximally LUE Coordination: decreased fine motor;decreased gross motor   Lower Extremity Assessment Lower Extremity Assessment: Defer to PT evaluation   Cervical / Trunk Assessment Cervical / Trunk Assessment: Normal   Communication Communication Communication: Other (comment) (delayed responses)   Cognition Arousal/Alertness: Awake/alert Behavior During Therapy: Flat affect Overall Cognitive Status: Impaired/Different from baseline Area of Impairment: Attention, Memory, Following commands, Safety/judgement, Awareness, Problem solving                   Current Attention Level: Focused Memory: Decreased short-term memory Following Commands: Follows one step commands with increased time Safety/Judgement: Decreased awareness of safety, Decreased awareness of deficits Awareness: Emergent Problem Solving: Slow processing General Comments: increased cues for safety/sequencing with ADL and mobility tasks     General Comments  VSS on RA    Exercises     Shoulder Instructions      Home Living Family/patient expects to be discharged to:: Private residence Living Arrangements: Other relatives (cousin) Available Help at Discharge: Family;Available PRN/intermittently Type of Home: Apartment Home Access: Stairs to enter Entrance Stairs-Number of Steps: flight   Home Layout: One level     Bathroom Shower/Tub: Tub/shower unit         Home Equipment: None          Prior Functioning/Environment Prior Level of Function : Independent/Modified Independent             Mobility Comments: no AD use ADLs Comments: works for [employer], [place]        OT Problem List: Decreased range of motion;Decreased strength;Decreased activity tolerance;Impaired balance (sitting and/or standing);Decreased cognition;Decreased safety awareness;Impaired UE functional  use      OT Treatment/Interventions: Self-care/ADL training;Therapeutic exercise;Energy conservation;DME and/or AE instruction;Therapeutic activities;Patient/family education;Balance training;Cognitive remediation/compensation    OT Goals(Current goals can be found in the care plan section) Acute Rehab OT Goals Patient Stated Goal: none stated OT Goal Formulation: With patient Time For Goal Achievement: 11/17/22 Potential to Achieve Goals: Good ADL Goals Pt Will Perform Upper Body Dressing: with min guard assist;sitting Pt/caregiver will Perform Home Exercise Program: Increased ROM;Increased strength;Left upper extremity;With Supervision;With written HEP provided Additional ADL Goal #1: pt will sit EOB x5 min without LOB in prep for seated ADLs  OT Frequency: Min 2X/week    Co-evaluation PT/OT/SLP Co-Evaluation/Treatment: Yes Reason for Co-Treatment: Complexity of the patient's impairments (multi-system involvement);For patient/therapist safety;To address functional/ADL transfers   OT goals addressed during session: ADL's and self-care      AM-PAC OT "6 Clicks" Daily Activity     Outcome Measure Help from another person eating meals?: A Little Help from another person taking care of personal grooming?: A Little Help from another person toileting, which includes using toliet, bedpan, or urinal?: A Lot Help from another person bathing (including washing, rinsing, drying)?: A Lot Help from another person to put on and taking off regular upper body clothing?: A Lot Help from another person to put on and taking off regular lower body clothing?: A Lot 6 Click Score: 14   End of Session Nurse Communication: Mobility status  Activity Tolerance: Patient tolerated treatment well Patient left: in chair;with call bell/phone within reach;with chair alarm set  OT Visit Diagnosis: Unsteadiness on feet (R26.81);Other abnormalities of gait and mobility (R26.89);Muscle weakness (generalized)  (M62.81);Other symptoms and signs involving cognitive function  Time: 1231-1300 OT Time Calculation (min): 29 min Charges:  OT General Charges $OT Visit: 1 Visit OT Evaluation $OT Eval Moderate Complexity: 1 Mod  Namiah Dunnavant K, OTD, OTR/L [employer] [facility] Acute Rehab [phone]   Renaye Rakers Koonce 11/03/2022, 2:48 PM

## 2022-11-03 NOTE — Progress Notes (Addendum)
STROKE TEAM PROGRESS NOTE   INTERVAL HISTORY Seen in room. Luis Roth tells [place] that his brother had a stroke and lives in [place]. They grew up in [place]. 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 Plan for a TCD bubble study, venous duplex, and TEE. (Message sent to cards master inbox) MRI scan of the brain shows a right ACA infarct.  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  Echocardiogram shows normal ejection fraction.  Urine drug screen is negative.  LDL cholesterol is 142 mg percent.  Hemoglobin A1c is 5.3. Vitals:   11/02/22 2038 11/02/22 2348 11/03/22 0333 11/03/22 0753  BP: (!) 188/107 (!) 177/99 (!) 188/105 (!) 176/110  Pulse: (!) 54 (!) 56 (!) 56 (!) 57  Resp: '17 18 17 18  '$ Temp: 98.4 F (36.9 C) 97.8 F (36.6 C) 98.6 F (37 C) 98.6 F (37 C)  TempSrc: Oral Oral Oral Oral  SpO2: 100% 100% 100% 100%  Weight:      Height:       CBC:  Recent Labs  Lab 11/02/22 1201 11/02/22 1350 11/03/22 0219  WBC 6.3  --  5.0  NEUTROABS 4.3  --   --   HGB 13.6 13.9 13.0  HCT 40.3 41.0 39.1  MCV 90.2  --  90.1  PLT 245  --  099   Basic Metabolic Panel:  Recent Labs  Lab 11/02/22 1201 11/02/22 1350 11/03/22 0219  NA 136 140 137  K 3.5 3.5 3.5  CL 104 103 104  CO2 24  --  23  GLUCOSE 147* 144* 107*  BUN '13 14 9  '$ CREATININE 1.29* 1.20 1.13  CALCIUM 9.3  --  9.1   Lipid Panel:  Recent Labs  Lab 11/03/22 0219  CHOL 211*  TRIG 92  HDL 51  CHOLHDL 4.1  VLDL 18  LDLCALC 142*   HgbA1c:  Recent Labs  Lab 11/03/22 0219  HGBA1C 5.3   Urine Drug Screen:  Recent Labs  Lab 11/02/22 1235  LABOPIA NONE DETECTED  COCAINSCRNUR NONE DETECTED  LABBENZ NONE DETECTED  AMPHETMU NONE DETECTED  THCU NONE DETECTED  LABBARB NONE DETECTED    Alcohol Level  Recent Labs  Lab 11/02/22 1201  ETH <10    IMAGING past 24 hours CT ANGIO HEAD NECK W WO CM  Result Date: 11/02/2022 CLINICAL DATA:  Stroke, follow-up. Bilateral lower extremity weakness. MRI  positive for right ACA territory infarcts. EXAM: CT ANGIOGRAPHY HEAD AND NECK TECHNIQUE: Multidetector CT imaging of the head and neck was performed using the standard protocol during bolus administration of intravenous contrast. Multiplanar CT image reconstructions and MIPs were obtained to evaluate the vascular anatomy. Carotid stenosis measurements (when applicable) are obtained utilizing NASCET criteria, using the distal internal carotid diameter as the denominator. RADIATION DOSE REDUCTION: This exam was performed according to the departmental dose-optimization program which includes automated exposure control, adjustment of the mA and/or kV according to patient size and/or use of iterative reconstruction technique. CONTRAST:  23m OMNIPAQUE IOHEXOL 350 MG/ML SOLN COMPARISON:  MR head without contrast and CT head without contrast 11/02/2022 FINDINGS: CTA NECK FINDINGS Aortic arch: A 3 vessel arch configuration is present. No focal atherosclerotic change or stenosis is present. Right carotid system: Right common carotid artery is within normal limits. Bifurcation is unremarkable. Cervical right ICA is normal. Left carotid system: The left common carotid artery is within normal limits. Bifurcation is unremarkable. The cervical left ICA is normal. Vertebral arteries: Right vertebral artery is dominant vessel.  Both vertebral arteries originate from the subclavian arteries without significant stenoses. No significant stenosis is present in either vertebral artery in the neck. Skeleton: Mild degenerative changes are present cervical spine. No focal osseous lesions are present. Other neck: Soft tissues the neck are otherwise unremarkable. Salivary glands are within normal limits. Thyroid is normal. No significant adenopathy is present. No focal mucosal or submucosal lesions are present. Upper chest: The visualized lung apices are clear. Review of the MIP images confirms the above findings CTA HEAD FINDINGS Anterior  circulation: Minimal calcifications are present within the cavernous internal carotid arteries bilaterally. No significant stenosis is present from the skull base through the ICA termini. The left A1 segment is aplastic. Both A2 segments fill from the right. The M1 segments are normal. The MCA bifurcations are within normal limits bilaterally. MCA branch vessels are normal bilaterally. Focal occlusion is present in the right pericallosal artery at the anterior genu distal to the origin of the callosum marginal vessel. Poor collateralization is present anteriorly, corresponding to the infarct. Left ACA branch vessels are within normal limits. Posterior circulation: Right vertebral artery is the dominant vessel. The PICA origins are visualized and normal. Vertebrobasilar junction and basilar artery are normal. Both posterior cerebral arteries originate from basilar tip. Moderate stenosis is present in the distal right P2 segment. Moderate stenoses are present in the superior P3 branches bilaterally. Venous sinuses: The dural sinuses are patent. The straight sinus and deep cerebral veins are intact. Cortical veins are within normal limits. No significant vascular malformation is evident. Anatomic variants: None Review of the MIP images confirms the above findings IMPRESSION: 1. 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 2. Moderate stenoses in the distal right P2 segment and superior P3 branches bilaterally. 3. Normal CTA of the neck. Electronically Signed   By: San Morelle M.D.   On: 11/02/2022 17:18   ECHOCARDIOGRAM COMPLETE  Result Date: 11/02/2022    ECHOCARDIOGRAM REPORT   Patient Name:   Luis Roth Date of Exam: 11/02/2022 Medical Rec #:  [id number]    Height:       63.0 in Accession #:    [id number]   Weight:       200.0 lb Date of Birth:  December 21, 1975   BSA:          1.934 m Patient Age:    47 years     BP:           201/102  mmHg Patient Gender: M            HR:           52 bpm. Exam Location:  Inpatient Procedure: 2D Echo, Cardiac Doppler and Color Doppler Indications:    Stroke I63.9  History:        Patient has no prior history of Echocardiogram examinations.                 Risk Factors:Hypertension and Non-Smoker.  Sonographer:    Greer Pickerel Referring Phys: [place]  Sonographer Comments: Image acquisition challenging due to respiratory motion. IMPRESSIONS  1. Left ventricular ejection fraction, by estimation, is 65 to 70%. The left ventricle has normal function. The left ventricle has no regional wall motion abnormalities. Left ventricular diastolic parameters are consistent with Grade I diastolic dysfunction (impaired relaxation).  2. Right ventricular systolic function is normal. The right ventricular size is normal. There is normal pulmonary artery systolic pressure. The estimated  right ventricular systolic pressure is 9.7 mmHg.  3. Left atrial size was mildly dilated.  4. The mitral valve is grossly normal. No evidence of mitral valve regurgitation. No evidence of mitral stenosis.  5. The aortic valve is tricuspid. Aortic valve regurgitation is not visualized. No aortic stenosis is present.  6. The inferior vena cava is normal in size with greater than 50% respiratory variability, suggesting right atrial pressure of 3 mmHg. Conclusion(s)/Recommendation(s): No intracardiac source of embolism detected on this transthoracic study. Consider a transesophageal echocardiogram to exclude cardiac source of embolism if clinically indicated. FINDINGS  Left Ventricle: Left ventricular ejection fraction, by estimation, is 65 to 70%. The left ventricle has normal function. The left ventricle has no regional wall motion abnormalities. The left ventricular internal cavity size was normal in size. There is  no left ventricular hypertrophy. Left ventricular diastolic parameters are consistent with Grade I diastolic dysfunction  (impaired relaxation). Right Ventricle: The right ventricular size is normal. No increase in right ventricular wall thickness. Right ventricular systolic function is normal. There is normal pulmonary artery systolic pressure. The tricuspid regurgitant velocity is 1.29 m/s, and  with an assumed right atrial pressure of 3 mmHg, the estimated right ventricular systolic pressure is 9.7 mmHg. Left Atrium: Left atrial size was mildly dilated. Right Atrium: Right atrial size was normal in size. Pericardium: There is no evidence of pericardial effusion. Mitral Valve: The mitral valve is grossly normal. No evidence of mitral valve regurgitation. No evidence of mitral valve stenosis. Tricuspid Valve: The tricuspid valve is grossly normal. Tricuspid valve regurgitation is trivial. No evidence of tricuspid stenosis. Aortic Valve: The aortic valve is tricuspid. Aortic valve regurgitation is not visualized. No aortic stenosis is present. Pulmonic Valve: The pulmonic valve was grossly normal. Pulmonic valve regurgitation is trivial. No evidence of pulmonic stenosis. Aorta: The aortic root and ascending aorta are structurally normal, with no evidence of dilitation. Venous: The inferior vena cava is normal in size with greater than 50% respiratory variability, suggesting right atrial pressure of 3 mmHg. IAS/Shunts: The atrial septum is grossly normal.  LEFT VENTRICLE PLAX 2D LVIDd:         4.70 cm   Diastology LVIDs:         2.90 cm   LV e' medial:    6.06 cm/s LV PW:         1.10 cm   LV E/e' medial:  11.5 LV IVS:        1.00 cm   LV e' lateral:   8.70 cm/s LVOT diam:     2.00 cm   LV E/e' lateral: 8.0 LV SV:         79 LV SV Index:   41 LVOT Area:     3.14 cm  RIGHT VENTRICLE RV S prime:     14.10 cm/s TAPSE (M-mode): 2.6 cm LEFT ATRIUM             Index        RIGHT ATRIUM           Index LA diam:        3.90 cm 2.02 cm/m   RA Area:     26.80 cm LA Vol (A2C):   55.9 ml 28.91 ml/m  RA Volume:   84.90 ml  43.91 ml/m LA Vol  (A4C):   80.5 ml 41.63 ml/m LA Biplane Vol: 71.9 ml 37.18 ml/m  AORTIC VALVE  PULMONIC VALVE LVOT Vmax:   110.00 cm/s PR End Diast Vel: 3.02 msec LVOT Vmean:  69.800 cm/s LVOT VTI:    0.252 m  AORTA Ao Root diam: 3.30 cm Ao Asc diam:  3.80 cm MITRAL VALVE               TRICUSPID VALVE MV Area (PHT): 3.42 cm    TR Peak grad:   6.7 mmHg MV Decel Time: 222 msec    TR Vmax:        129.00 cm/s MV E velocity: 69.40 cm/s MV A velocity: 73.70 cm/s  SHUNTS MV E/A ratio:  0.94        Systemic VTI:  0.25 m                            Systemic Diam: 2.00 cm Eleonore Chiquito MD Electronically signed by Eleonore Chiquito MD Signature Date/Time: 11/02/2022/5:05:02 PM    Final    MR Cervical Spine Wo Contrast  Result Date: 11/02/2022 CLINICAL DATA:  Inability to use left arm arm and leg. EXAM: MRI CERVICAL SPINE WITHOUT CONTRAST TECHNIQUE: Multiplanar, multisequence MR imaging of the cervical spine was performed. No intravenous contrast was administered. COMPARISON:  Same-day CT cervical spine. FINDINGS: Image quality is moderately motion degraded, limiting evaluation of neural foraminal stenosis particularly. Alignment: There is straightening of the normal cervical curvature. Vertebrae: Vertebral body heights are preserved. Background marrow signal is normal. There is no suspicious marrow signal abnormality or marrow edema. Cord: Normal in signal and morphology. Posterior Fossa, vertebral arteries, paraspinal tissues: The posterior fossa is assessed on the separately dictated brain MRI. The vertebral artery flow voids are normal. There is lipoma in the right posterior neck measuring up to 2.3 cm transverse (15-5). The paraspinal soft tissues are otherwise unremarkable. Disc levels: C2-C3: No significant spinal canal or neural foraminal stenosis C3-C4: There is left worse than right uncovertebral spurring resulting in moderate left and no significant right neural foraminal stenosis and no significant spinal canal stenosis  C4-C5: There is a left paracentral broad-based posterior disc osteophyte complex and bilateral uncovertebral spurring resulting in mild spinal canal stenosis and moderate to severe left and moderate right neural foraminal stenosis C5-C6: There is mild left worse than right uncovertebral ridging resulting in mild left and no significant right neural foraminal stenosis and no significant spinal canal stenosis C6-C7: There is prominent left uncovertebral ridging resulting in severe left neural foraminal stenosis. No significant spinal canal or right neural foraminal stenosis. C7-T1: No significant spinal canal or neural foraminal stenosis. IMPRESSION: 1. Motion degraded study. 2. Multilevel neural foraminal stenosis as above, moderate on the left at C3-C4, moderate to severe on the left and moderate on the right at C4-C5, and severe on the left at C6-C7. No greater than mild spinal canal stenosis. Electronically Signed   By: Valetta Mole M.D.   On: 11/02/2022 15:45   MR BRAIN WO CONTRAST  Result Date: 11/02/2022 CLINICAL DATA:  Three days of unilateral left-sided weakness. Inability to move left leg and arm. EXAM: MRI HEAD WITHOUT CONTRAST TECHNIQUE: Multiplanar, multiecho pulse sequences of the brain and surrounding structures were obtained without intravenous contrast. COMPARISON:  Same-day CT head. FINDINGS: Brain: There is patchy diffusion restriction with associated FLAIR signal abnormality in the right frontal lobe throughout the ACA distribution consistent with acute to early subacute infarcts. There is involvement of the right aspect of the corpus callosum. There is no associated hemorrhage or  mass effect. There is no acute intracranial hemorrhage or extra-axial fluid collection. Background parenchymal volume is normal. The ventricles are normal in size. There is mild FLAIR signal abnormality in the remainder of the subcortical and periventricular white matter likely reflecting mild background chronic  small-vessel ischemic change. The pituitary and suprasellar region are normal. There is no mass lesion. There is no mass effect or midline shift. Vascular: Normal flow voids. Skull and upper cervical spine: Normal marrow signal. Sinuses/Orbits: There is mucosal thickening in the left maxillary sinus. The globes and orbits are unremarkable. IMPRESSION: Patchy acute to early subacute infarcts throughout the right ACA distribution without hemorrhage or mass effect. Electronically Signed   By: Valetta Mole M.D.   On: 11/02/2022 15:32   CT HEAD WO CONTRAST  Result Date: 11/02/2022 CLINICAL DATA:  Left-sided weakness EXAM: CT HEAD WITHOUT CONTRAST TECHNIQUE: Contiguous axial images were obtained from the base of the skull through the vertex without intravenous contrast. RADIATION DOSE REDUCTION: This exam was performed according to the departmental dose-optimization program which includes automated exposure control, adjustment of the mA and/or kV according to patient size and/or use of iterative reconstruction technique. COMPARISON:  None Available. FINDINGS: Brain: There are no signs of bleeding within the cranium. Ventricles are not dilated. There is subtle decrease in density in the anteromedial right frontal lobe. No other focal abnormalities are seen. Vascular: Ventricles are not dilated. Skull: No fracture is seen. There is small area of contusion/hematoma in the left posterior parietal cortex. Sinuses/Orbits: There is mucosal thickening in ethmoid and maxillary sinuses. Other: None IMPRESSION: There are no signs of bleeding within the cranium. There is ill-defined area of subtle decreased density in the anteromedial right frontal lobe. Possibility of ischemic change is not excluded. Follow-up MRI may be considered. Imaging findings were relayed to patient's provider [place] by telephone call. Electronically Signed   By: Elmer Picker M.D.   On: 11/02/2022 13:13   CT CERVICAL SPINE WO CONTRAST  Result  Date: 11/02/2022 CLINICAL DATA:  Three days of unilateral left-sided weakness. Unable to move left leg and arm. EXAM: CT CERVICAL SPINE WITHOUT CONTRAST TECHNIQUE: Multidetector CT imaging of the cervical spine was performed without intravenous contrast. Multiplanar CT image reconstructions were also generated. RADIATION DOSE REDUCTION: This exam was performed according to the departmental dose-optimization program which includes automated exposure control, adjustment of the mA and/or kV according to patient size and/or use of iterative reconstruction technique. COMPARISON:  None Available. FINDINGS: Alignment: There is straightening of the normal cervical lordosis. There is no antero or retrolisthesis. There is no evidence of traumatic malalignment. Skull base and vertebrae: Skull base alignment is maintained. Vertebral body heights are preserved. There is no evidence of acute fracture. There is no suspicious osseous lesion. Soft tissues and spinal canal: No prevertebral fluid or swelling. No visible canal hematoma. Disc levels: C2-C3: No significant spinal canal or neural foraminal stenosis C3-C4: There is left worse than right uncovertebral ridging resulting in moderate to severe left and mild right neural foraminal stenosis without significant spinal canal stenosis C4-C5: There is left worse than right uncovertebral ridging resulting in moderate bilateral neural foraminal stenosis without significant spinal canal stenosis C5-C6: There is mild bilateral uncovertebral ridging without high-grade spinal canal or neural foraminal stenosis. C6-C7: There is prominent left uncovertebral ridging resulting in severe left neural foraminal stenosis. No significant spinal canal or right neural foraminal stenosis C7-T1: No significant spinal canal or neural foraminal stenosis. Upper chest: The imaged lung apices are clear.  Other: None. IMPRESSION: Multilevel left worse than right uncovertebral ridging in the cervical spine as  detailed above resulting in moderate to severe left neural foraminal stenosis at C3-C4, moderate bilateral neural foraminal stenosis at C4-C5, and severe left neural foraminal stenosis at C6-C7. Electronically Signed   By: Valetta Mole M.D.   On: 11/02/2022 13:08    PHYSICAL EXAM  Physical Exam  Constitutional: Appears well-developed and well-nourished.   Cardiovascular: Normal rate and regular rhythm.  Respiratory: Effort normal, non-labored breathing  Neuro: Mental Status: Patient is awake, alert, oriented to person, place, month, year, and situation. Patient is able to give a clear and coherent history. No signs of aphasia or neglect Cranial Nerves: II: Visual Fields are full. PERRL III,IV, VI: EOMI  VII: Facial movement is symmetric resting and smiling VIII: Hearing is intact to voice X: Palate elevates symmetrically XI: Shoulder shrug is symmetric. XII: Tongue protrudes midline without atrophy or fasciculations.  Motor: Tone is normal. Bulk is normal.  Tone but no strength in left leg RUE 5/5, no drift LUE 2/5, able to lift slightly off bed, slight resistance to gravity. Hand grasp is weak as well RLE: 5/5, no drift LLE: 0/5, no movement spontaneously, to command or to noxious.  Increased tone in the left lower extremity Sensory: Sensation is symmetric to light touch  Cerebellar: FNF and HKS intact on the right, unable to perform on the left        ASSESSMENT/PLAN Luis Roth is a 47 y.o. male with history of HTN who presented to the ED with new onset of left-sided weakness and inability to stand.    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 MRI  Patchy acute to early subacute infarcts throughout the right ACA distribution without hemorrhage or mass effect. Hypercoag workup pending  TEE pending TCD bubble study 2D Echo EF 65-70%, LA mildly dilated LDL 142 HgbA1c 5.3 VTE prophylaxis - SCDs    Diet   Diet regular Room service appropriate? Yes; Fluid consistency: Thin   No antithrombotic prior to admission, now on aspirin 81 mg daily.  And Plavix 75 mg daily for 3 weeks and then aspirin alone Therapy recommendations:  pending  Disposition:  pending   Hypertension Permissive hypertension (OK if < 220/120) but gradually normalize Long-term BP goal normotensive  Hyperlipidemia LDL 142, goal < 70 Add atorvastatin '40mg'$   Continue statin at discharge   Other Stroke Risk Factors Obesity, Body mass index is 35.43 kg/m., BMI >/= 30 associated with increased stroke risk, recommend weight loss, diet and exercise as appropriate  Family hx stroke (brother)  Other [facility] day # 0  Patient seen and examined by NP/APP with MD. MD to update note as needed.   Janine Ores, DNP, FNP-BC [facility] Pager: [phone]  STROKE MD NOTE :  I have personally obtained history,examined this patient, reviewed notes, independently viewed imaging studies, participated in medical decision making and plan of care.ROS completed by me personally and pertinent positives fully documented  I have made any additions or clarifications directly to the above note. Agree with note above.  Patient presented with left leg weakness secondary to patchy right anterior cerebral artery embolic infarcts from  cryptogenic source.  Recommend continue ongoing stroke evaluation and check TEE and main likely need prolonged cardiac monitoring at discharge.  Check TCD bubble study for PFO and lower extremity venous Dopplers for DVT.  Check lab work for hypercoagulability and ANA panel.  Mobilize out of bed.  Therapy consults.   Will likely need inpatient rehab.  Recommend aspirin and Plavix for 3 weeks followed by aspirin alone greater than 50% time during this 50-minute visit was spent on counseling and coordination of care about his embolic stroke and left leg weakness and discussion about stroke evaluation and treatment and answering questions. Antony Contras, MD Medical Director [facility] Pager: [phone] 11/03/2022 4:30 PM   To contact Stroke Continuity provider, please refer to [email or web address] After hours, contact General Neurology

## 2022-11-03 NOTE — Progress Notes (Addendum)
Daily Progress Note Intern Pager: [phone]  Patient name: Luis Roth Medical record number: [id number] Date of birth: Mar 15, 1976 Age: 47 y.o. Gender: male  Primary Care Provider: Patient, [name] Consultants: Neurology Code Status: Full  Pt Overview and Major Events to Date:  1/12 Admitted  Assessment and Plan: Luis Roth is a 47yo M w/ hx of HTN that is admitted for subacute ischemic stroke of R ACA.   * Stroke (HCC) A1c wnl. Lipid panel notable for elevated LDL. CTA showed focal occlusion of R pericallosal artery, moderate stenosis of R P2 and superior P3 branches BL, and normal CTA of neck. Echo wnl. Uncontrolled HTN is likely a strong stroke risk factor for this pt. - Start Rosuvastatin '20mg'$  daily for secondary prevention - Cont ASA '81mg'$  daily - Neurology consulted, appreciate  - NPO diet, pending SLP eval  - PT/OT to treat - Fluids: 100 mL/hr maintenance while NPO - AM CBC/BMP  - Fall precautions - consider adding BP PRN med for SBP >220. Will need long-term optimization of HTN control.   Abnormal ECG Trops wnl x2. Echo largely unremarkable w/ normal LVF.   Hyperlipidemia LDL elevated. - Start rosuvastatin '20mg'$  daily   FEN/GI: NPO pending speech eval PPx: Lovenox Dispo:Pending PT recommendations  pending clinical improvement . Barriers include stroke workup.   Subjective:  Overall feels better since admission. Still unable to move L arm and leg. Reports brother, niece, and cousin have had strokes. Agreeable to starting statin therapy.   Objective: Temp:  [97.4 F (36.3 C)-98.6 F (37 C)] 98.6 F (37 C) (01/13 0333) Pulse Rate:  [49-133] 56 (01/13 0333) Resp:  [15-18] 17 (01/13 0333) BP: (163-201)/(99-107) 188/105 (01/13 0333) SpO2:  [95 %-100 %] 100 % (01/13 0333) Weight:  [90.7 kg] 90.7 kg (01/12 1223) Physical Exam: General: Alert, pleasant, laying in bed comfortably. NAD. Cardiovascular: RRR Respiratory: Normal WOB on RA. CTAB. Abdomen: Soft,  nontender, nondistended. Normal BS. Neuro: Unable to move L LE and L UE.  Laboratory: Most recent CBC Lab Results  Component Value Date   WBC 5.0 11/03/2022   HGB 13.0 11/03/2022   HCT 39.1 11/03/2022   MCV 90.1 11/03/2022   PLT 218 11/03/2022   Most recent BMP    Latest Ref Rng & Units 11/03/2022    2:19 AM  BMP  Glucose 70 - 99 mg/dL 107   BUN 6 - 20 mg/dL 9   Creatinine 0.61 - 1.24 mg/dL 1.13   Sodium 135 - 145 mmol/L 137   Potassium 3.5 - 5.1 mmol/L 3.5   Chloride 98 - 111 mmol/L 104   CO2 22 - 32 mmol/L 23   Calcium 8.9 - 10.3 mg/dL 9.1    A1c: 5.3  Imaging/Diagnostic Tests: CTA: 1. 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 2. Moderate stenoses in the distal right P2 segment and superior P3 branches bilaterally. 3. Normal CTA of the neck.  Echo: 1. Left ventricular ejection fraction, by estimation, is 65 to 70%. The  left ventricle has normal function. The left ventricle has no regional  wall motion abnormalities. Left ventricular diastolic parameters are  consistent with Grade I diastolic  dysfunction (impaired relaxation).   2. Right ventricular systolic function is normal. The right ventricular  size is normal. There is normal pulmonary artery systolic pressure. The  estimated right ventricular systolic pressure is 9.7 mmHg.   3. Left atrial size was mildly dilated.   4.  The mitral valve is grossly normal. No evidence of mitral valve  regurgitation. No evidence of mitral stenosis.   5. The aortic valve is tricuspid. Aortic valve regurgitation is not  visualized. No aortic stenosis is present.   6. The inferior vena cava is normal in size with greater than 50%  respiratory variability, suggesting right atrial pressure of 3 mmHg.    Arlyce Dice, MD 11/03/2022, 6:40 AM  PGY-1, [place] Intern pager: [phone], text pages welcome Secure chat group [place]

## 2022-11-03 NOTE — Evaluation (Signed)
Clinical/Bedside Swallow Evaluation Patient Details  Name: Luis Roth MRN: [id number] Date of Birth: [birth year]  Today's Date: 11/03/2022 Time: SLP Start Time (ACUTE ONLY): 1435 SLP Stop Time (ACUTE ONLY): 1450 SLP Time Calculation (min) (ACUTE ONLY): 15 min  Past Medical History: History reviewed. No pertinent past medical history. Past Surgical History:  Past Surgical History:  Procedure Laterality Date   ANKLE FRACTURE SURGERY Right    HPI:  Luis Roth is a 47 y.o. male presenting with left sided weakness. MRI remarkable for "Patchy acute to early subacute infarcts throughout the right ACA distribution without hemorrhage or mass effect."    Assessment / Plan / Recommendation  Clinical Impression  Pt was seen for a bedside swallow evaluation and he presents with overall functional oropharyngeal swallowing abilities.  Oral mechanism examination was remarkable reduced L facial ROM, L lingual deviation, and reduced L lingual ROM.  Pt consumed trials of thin liquid, puree, and regular solids.  Mastication was timely and no overt s/sx of aspiration were observed with trials.  Recommend continuation of regular solids and thin liquids with medication administered whole with liquid.    Brief cognitive-linguistic screen was completed.  Pt reported that his rate of speech was reduced, but stated that otherwise speech, language, and cognitive abilities were at baseline. Pt's friends endorsed this.  Would recommend a comprehensive cognitive-linguistic evaluation in the setting of acute CVA.   SLP Visit Diagnosis: Dysphagia, unspecified (R13.10)    Aspiration Risk  Mild aspiration risk    Diet Recommendation Regular;Thin liquid   Liquid Administration via: Cup;Straw Medication Administration: Whole meds with liquid Supervision: Patient able to self feed;Intermittent supervision to cue for compensatory strategies Compensations: Minimize environmental distractions;Slow rate;Small  sips/bites Postural Changes: Seated upright at 90 degrees    Other  Recommendations Oral Care Recommendations: Oral care BID    Recommendations for follow up therapy are one component of a multi-disciplinary discharge planning process, led by the attending physician.  Recommendations may be updated based on patient status, additional functional criteria and insurance authorization.  Follow up Recommendations No SLP follow up for dysphagia      Assistance Recommended at Discharge    Functional Status Assessment Patient has had a recent decline in their functional status and demonstrates the ability to make significant improvements in function in a reasonable and predictable amount of time.  Frequency and Duration min 1 x/week  1 week       Prognosis Prognosis for Safe Diet Advancement: Good      Swallow Study   General HPI: Luis Roth is a 47 y.o. male presenting with left sided weakness. MRI remarkable for "Patchy acute to early subacute infarcts throughout the right ACA distribution without hemorrhage or mass effect." Type of Study: Bedside Swallow Evaluation Previous Swallow Assessment: none Diet Prior to this Study: Regular;Thin liquids Temperature Spikes Noted: No Respiratory Status: Room air History of Recent Intubation: No Behavior/Cognition: Alert;Cooperative Oral Cavity Assessment: Within Functional Limits Oral Care Completed by SLP: No Oral Cavity - Dentition: Adequate natural dentition Vision: Functional for self-feeding Self-Feeding Abilities: Able to feed self;Needs set up Patient Positioning: Upright in chair Baseline Vocal Quality: Normal Volitional Swallow: Able to elicit    Oral/Motor/Sensory Function Overall Oral Motor/Sensory Function: Mild impairment Facial ROM: Reduced left;Suspected CN VII (facial) dysfunction Facial Symmetry: Within Functional Limits Lingual ROM: Reduced left;Suspected CN XII (hypoglossal) dysfunction Lingual Symmetry: Abnormal  symmetry left;Suspected CN XII (hypoglossal) dysfunction Lingual Strength: Reduced   Ice Chips Ice chips: Not tested   Thin  Liquid Thin Liquid: Within functional limits Presentation: Cup;Straw    Nectar Thick Nectar Thick Liquid: Not tested   Honey Thick Honey Thick Liquid: Not tested   Puree Puree: Within functional limits Presentation: Spoon;Self Fed   Solid     Solid: Within functional limits Presentation: [place], M.S., [place] Office: [phone]  [place] 11/03/2022,3:06 PM

## 2022-11-04 ENCOUNTER — Inpatient Hospital Stay (HOSPITAL_COMMUNITY): Payer: 59

## 2022-11-04 DIAGNOSIS — I10 Essential (primary) hypertension: Secondary | ICD-10-CM | POA: Diagnosis not present

## 2022-11-04 DIAGNOSIS — R9431 Abnormal electrocardiogram [ECG] [EKG]: Secondary | ICD-10-CM

## 2022-11-04 DIAGNOSIS — I63421 Cerebral infarction due to embolism of right anterior cerebral artery: Secondary | ICD-10-CM | POA: Diagnosis not present

## 2022-11-04 DIAGNOSIS — I639 Cerebral infarction, unspecified: Secondary | ICD-10-CM

## 2022-11-04 DIAGNOSIS — E782 Mixed hyperlipidemia: Secondary | ICD-10-CM | POA: Diagnosis not present

## 2022-11-04 DIAGNOSIS — I6339 Cerebral infarction due to thrombosis of other cerebral artery: Secondary | ICD-10-CM | POA: Diagnosis not present

## 2022-11-04 DIAGNOSIS — I6300 Cerebral infarction due to thrombosis of unspecified precerebral artery: Secondary | ICD-10-CM | POA: Diagnosis not present

## 2022-11-04 MED ORDER — AMLODIPINE BESYLATE 10 MG PO TABS
10.0000 mg | ORAL_TABLET | Freq: Every day | ORAL | Status: DC
  Administered 2022-11-04 – 2022-11-12 (×9): 10 mg via ORAL
  Filled 2022-11-04 (×9): qty 1

## 2022-11-04 NOTE — Progress Notes (Signed)
     Daily Progress Note Intern Pager: [phone]  Patient name: Zackaria Burkey Medical record number: [id number] Date of birth: 03-Mar-1976 Age: 47 y.o. Gender: male  Primary Care Provider: Patient, [name] Consultants: Neurology  Code Status: Full Code   Pt Overview and Major Events to Date:  1/12:Admitted   Assessment and Plan:  [name] is a 47yo M w/ hx of HTN that is admitted for subacute ischemic stroke of R ACA.   * Stroke ([place]) Appears to be improving, grip strength on the left hand is better today.  - Rosuvastatin '20mg'$  daily - Plavix 75 mg for 3 weeks - ASA '81mg'$  daily - amlodipine 10 mg for blood pressure control  - Neurology consulted, appreciate  - PT/OT - Fall precautions  Hyperlipidemia LDL elevated. - Start rosuvastatin '20mg'$  daily   FEN/GI: regular PPx: Lovenox  Dispo:CIR . Barriers include placement.   Subjective:  Felling well and reports improvement. Understands prognosis and needing placement in rehab for strengthening   Objective: Temp:  [98 F (36.7 C)-98.9 F (37.2 C)] 98.2 F (36.8 C) (01/14 0940) Pulse Rate:  [49-70] 57 (01/14 0940) Resp:  [16-18] 16 (01/14 0940) BP: (169-207)/(80-115) 201/115 (01/14 0940) SpO2:  [97 %-100 %] 100 % (01/14 0357) Physical Exam: Well-appearing, no acute distress Cardio: Regular rate, regular rhythm, no murmurs on exam. Pulm: Clear, no wheezing, no crackles. No increased work of breathing Abdominal: bowel sounds present, soft, non-tender, non-distended Extremities: no peripheral edema  Neuro: alert and oriented x3, speech normal in content, no facial asymmetry, strength intact on the right, severely weak on the left, pupils equal and reactive to light.    Laboratory: Most recent CBC Lab Results  Component Value Date   WBC 5.0 11/03/2022   HGB 13.0 11/03/2022   HCT 39.1 11/03/2022   MCV 90.1 11/03/2022   PLT 218 11/03/2022   Most recent BMP    Latest Ref Rng & Units 11/03/2022    2:19 AM  BMP   Glucose 70 - 99 mg/dL 107   BUN 6 - 20 mg/dL 9   Creatinine 0.61 - 1.24 mg/dL 1.13   Sodium 135 - 145 mmol/L 137   Potassium 3.5 - 5.1 mmol/L 3.5   Chloride 98 - 111 mmol/L 104   CO2 22 - 32 mmol/L 23   Calcium 8.9 - 10.3 mg/dL 9.1    Darci Current, DO 11/04/2022, 10:30 AM  PGY-1, [place] Intern pager: [phone], text pages welcome Secure chat group [place]

## 2022-11-04 NOTE — Progress Notes (Signed)
Dr. Jinny Sanders was made aware that pt's BP was 200/111 and manual BP was 180/100, HR=55. Pt denied Headache. No new orders given. Dr. Jinny Sanders said to notified FMT if BP is> 220/110.

## 2022-11-04 NOTE — Progress Notes (Signed)
STROKE TEAM PROGRESS NOTE   INTERVAL HISTORY Seen in room.  He states his neurological exam is unchanged with dense left hemiplegia.  Vital signs are stable.  Blood pressure remains elevated.  HIV and RPR are negative.  Hypercoagulable labs are pending.  TCD bubble study is pending. Vitals:   11/04/22 0446 11/04/22 0751 11/04/22 0940 11/04/22 1129  BP: (!) 180/100 (!) 207/112 (!) 201/115 (!) 196/119  Pulse:  (!) 56 (!) 57 62  Resp:  '16 16 16  '$ Temp:  98.2 F (36.8 C) 98.2 F (36.8 C) 98.3 F (36.8 C)  TempSrc:  Oral  Oral  SpO2:    100%  Weight:      Height:       CBC:  Recent Labs  Lab 11/02/22 1201 11/02/22 1350 11/03/22 0219  WBC 6.3  --  5.0  NEUTROABS 4.3  --   --   HGB 13.6 13.9 13.0  HCT 40.3 41.0 39.1  MCV 90.2  --  90.1  PLT 245  --  834   Basic Metabolic Panel:  Recent Labs  Lab 11/02/22 1201 11/02/22 1350 11/03/22 0219  NA 136 140 137  K 3.5 3.5 3.5  CL 104 103 104  CO2 24  --  23  GLUCOSE 147* 144* 107*  BUN '13 14 9  '$ CREATININE 1.29* 1.20 1.13  CALCIUM 9.3  --  9.1   Lipid Panel:  Recent Labs  Lab 11/03/22 0219  CHOL 211*  TRIG 92  HDL 51  CHOLHDL 4.1  VLDL 18  LDLCALC 142*   HgbA1c:  Recent Labs  Lab 11/03/22 0219  HGBA1C 5.3   Urine Drug Screen:  Recent Labs  Lab 11/02/22 1235  LABOPIA NONE DETECTED  COCAINSCRNUR NONE DETECTED  LABBENZ NONE DETECTED  AMPHETMU NONE DETECTED  THCU NONE DETECTED  LABBARB NONE DETECTED    Alcohol Level  Recent Labs  Lab 11/02/22 1201  ETH <10    IMAGING past 24 hours No results found.  PHYSICAL EXAM  Physical Exam  Constitutional: Appears well-developed and well-nourished.  Middle-aged Luis Roth male Cardiovascular: Normal rate and regular rhythm.  Respiratory: Effort normal, non-labored breathing  Neuro: Mental Status: Patient is awake, alert, oriented to person, place, month, year, and situation. Patient is able to give a clear and coherent history. No signs of aphasia or  neglect Cranial Nerves: II: Visual Fields are full. PERRL III,IV, VI: EOMI  VII: Facial movement is symmetric resting and smiling VIII: Hearing is intact to voice X: Palate elevates symmetrically XI: Shoulder shrug is symmetric. XII: Tongue protrudes midline without atrophy or fasciculations.  Motor: Tone is normal. Bulk is normal.  Tone but no strength in left leg RUE 5/5, no drift LUE 2/5, able to lift slightly off bed, slight resistance to gravity. Hand grasp is weak as well RLE: 5/5, no drift LLE: 0/5, no movement spontaneously, to command or to noxious.  Increased tone in the left lower extremity Sensory: Sensation is symmetric to light touch  Cerebellar: FNF and HKS intact on the right, unable to perform on the left        ASSESSMENT/PLAN [name] is a 47 y.o. male with history of HTN who presented to the ED with new onset of left-sided weakness and inability to stand.    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 MRI  Patchy acute to early subacute infarcts throughout the right ACA distribution without hemorrhage or mass effect. Hypercoag workup pending  TEE pending TCD bubble study 2D Echo EF 65-70%, LA mildly dilated LDL 142 HgbA1c 5.3 VTE prophylaxis - SCDs    Diet   Diet regular Room service appropriate? Yes; Fluid consistency: Thin   No antithrombotic prior to admission, now on aspirin 81 mg daily.  And Plavix 75 mg daily for 3 weeks and then aspirin alone Therapy recommendations:  pending  Disposition:  pending   Hypertension Permissive hypertension (OK if < 220/120) but gradually normalize Long-term BP goal  normotensive  Hyperlipidemia LDL 142, goal < 70 Add atorvastatin '40mg'$   Continue statin at discharge   Other Stroke Risk Factors Obesity, Body mass index is 35.43 kg/m., BMI >/= 30 associated with increased stroke risk, recommend weight loss, diet and exercise as appropriate  Family hx stroke (brother)  Other [facility] day # 1    Patient presented with left leg weakness secondary to 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  Recommend  check TEE and main likely need prolonged cardiac monitoring at discharge.  Check TCD bubble study for PFO and lower extremity venous Dopplers for DVT.  Check lab work for hypercoagulability and ANA panel.  Mobilize out of bed.  Therapy consults.  Will likely need inpatient rehab.  Continue aspirin and Plavix for 3 weeks followed by aspirin alone greater than 50% time during this 35-minute visit was spent on counseling and coordination of care about his embolic stroke and left leg weakness and discussion about stroke evaluation and treatment and answering questions. Antony Contras, MD Medical Director [facility] Pager: [phone] 11/04/2022 1:26 PM   To contact Stroke Continuity provider, please refer to [email or web address] After hours, contact General Neurology

## 2022-11-04 NOTE — Progress Notes (Signed)
VASCULAR LAB    Bilateral lower extremity venous duplex has been performed.  See CV proc for preliminary results.   Nicklas Mcsweeney, RVT 11/04/2022, 2:54 PM

## 2022-11-04 NOTE — Progress Notes (Signed)
TCD bubble study has been completed.    Results can be found under chart review under CV PROC. 11/04/2022 2:52 PM Jillianne Gamino RVT, RDMS

## 2022-11-04 NOTE — Progress Notes (Signed)
IP rehab admissions - screened for potential acute inpatient rehab admission.  Will place a rehab consult order per protocol for a full assessment.  Call me for questions.  [phone]

## 2022-11-04 NOTE — Plan of Care (Signed)
na

## 2022-11-05 DIAGNOSIS — I6339 Cerebral infarction due to thrombosis of other cerebral artery: Secondary | ICD-10-CM | POA: Diagnosis not present

## 2022-11-05 DIAGNOSIS — I63421 Cerebral infarction due to embolism of right anterior cerebral artery: Secondary | ICD-10-CM | POA: Diagnosis not present

## 2022-11-05 LAB — HOMOCYSTEINE: Homocysteine: 12.2 umol/L (ref 0.0–14.5)

## 2022-11-05 LAB — ANA W/REFLEX IF POSITIVE: Anti Nuclear Antibody (ANA): NEGATIVE

## 2022-11-05 MED ORDER — ATORVASTATIN CALCIUM 80 MG PO TABS
80.0000 mg | ORAL_TABLET | Freq: Every day | ORAL | Status: DC
  Administered 2022-11-06 – 2022-11-12 (×7): 80 mg via ORAL
  Filled 2022-11-05 (×7): qty 1

## 2022-11-05 MED ORDER — LOSARTAN POTASSIUM 50 MG PO TABS: 50.0000 mg | ORAL_TABLET | Freq: Two times a day (BID) | ORAL | Status: DC

## 2022-11-05 MED ORDER — ENOXAPARIN SODIUM 40 MG/0.4ML IJ SOSY
40.0000 mg | PREFILLED_SYRINGE | INTRAMUSCULAR | Status: DC
  Administered 2022-11-05 – 2022-11-12 (×8): 40 mg via SUBCUTANEOUS
  Filled 2022-11-05 (×8): qty 0.4

## 2022-11-05 MED ORDER — LOSARTAN POTASSIUM 50 MG PO TABS
50.0000 mg | ORAL_TABLET | Freq: Every day | ORAL | Status: DC
  Administered 2022-11-05 – 2022-11-06 (×2): 50 mg via ORAL
  Filled 2022-11-05 (×2): qty 1

## 2022-11-05 MED ORDER — SODIUM CHLORIDE 0.9 % IV SOLN: INTRAVENOUS | Status: DC

## 2022-11-05 NOTE — Progress Notes (Addendum)
     Daily Progress Note Intern Pager: [phone]  Patient name: Shlomo Seres Medical record number: [id number] Date of birth: 1976-04-18 Age: 47 y.o. Gender: male  Primary Care Provider: Patient, [name] Consultants: Neurology  Code Status: Full Code   Pt Overview and Major Events to Date:  1/12: Admitted   Assessment and Plan:  [name] is a 47yo M w/ hx of HTN that is admitted for subacute ischemic stroke of R ACA.    * Stroke ([place]) Appears to be improving, grip strength on the left hand is better today.  - Increase Atorvastatin '80mg'$  daily (1/15) - Plavix 75 mg for 3 weeks - ASA '81mg'$  daily - amlodipine 10 mg (started 1/14) - add Losartan 50 mg (started 1/15) - Neurology consulted, appreciate recommendations  - TEE w/ cardiology pending with continued cardiac outpatient monitoring  - PT/OT - Fall precautions - Pending CIR placement   Hyperlipidemia LDL elevated. - Start rosuvastatin '20mg'$  daily   FEN/GI: regular  PPx: Lovenox  Dispo:CIR today. Barriers include pending neurology recommendations.   Subjective:  Doing well. Is motivated to continue working with PT.   Objective: Temp:  [97.8 F (36.6 C)-98.4 F (36.9 C)] 98.2 F (36.8 C) (01/15 1205) Pulse Rate:  [56-69] 67 (01/15 1205) Resp:  [18] 18 (01/15 1205) BP: (143-188)/(86-117) 174/117 (01/15 1205) SpO2:  [95 %-100 %] 100 % (01/15 1205) Physical Exam: Well-appearing, no acute distress Cardio: Regular rate, regular rhythm, no murmurs on exam. Pulm: Clear, no wheezing, no crackles. No increased work of breathing Abdominal: bowel sounds present, soft, non-tender, non-distended Extremities: no peripheral edema  Neuro: alert and oriented x3, speech normal in content, no facial asymmetry, strength intact and equal bilaterally in UE and LE, pupils equal and reactive to light.   Laboratory: Most recent CBC Lab Results  Component Value Date   WBC 5.0 11/03/2022   HGB 13.0 11/03/2022   HCT 39.1 11/03/2022    MCV 90.1 11/03/2022   PLT 218 11/03/2022   Most recent BMP    Latest Ref Rng & Units 11/03/2022    2:19 AM  BMP  Glucose 70 - 99 mg/dL 107   BUN 6 - 20 mg/dL 9   Creatinine 0.61 - 1.24 mg/dL 1.13   Sodium 135 - 145 mmol/L 137   Potassium 3.5 - 5.1 mmol/L 3.5   Chloride 98 - 111 mmol/L 104   CO2 22 - 32 mmol/L 23   Calcium 8.9 - 10.3 mg/dL 9.1    Darci Current, DO 11/05/2022, 1:18 PM  PGY-1, [place] Intern pager: [phone], text pages welcome Secure chat group [place]

## 2022-11-05 NOTE — TOC CAGE-AID Note (Signed)
Transition of Care [facility]) - CAGE-AID Screening   Patient Details  Name: Luis Roth MRN: [id number] Date of Birth: Nov 26, 1975  Transition of [facility]) CM/SW Contact:    Bethann Berkshire, [place] Phone Number: 11/05/2022, 12:38 PM   Clinical Narrative:  CSW administered CAGE-AID screening with patient; score of 3. Pt endorses alcohol use; denies any other substance use. Reports he drinks on weekends and that when he drinks he has about 4 shots of liquor. He is interested in resources to help with alcohol cessation. CSW provides local tx resource list and explained different levels of care. CSW also wrote local AA website address on resource list and showed pt how to access website on his  phone.   CAGE-AID Screening:    Have You Ever Felt You Ought to Cut Down on Your Drinking or Drug Use?: Yes Have People Annoyed You By Critizing Your Drinking Or Drug Use?: Yes Have You Felt Bad Or Guilty About Your Drinking Or Drug Use?: Yes      Substance Abuse Education Offered: Yes  Substance abuse interventions: Patient Counseling, Other (must comment) (tx resource list given)

## 2022-11-05 NOTE — H&P (View-Only) (Signed)
STROKE TEAM PROGRESS NOTE   INTERVAL HISTORY Seen in room.  He states his neurological exam is unchanged with dense left hemiplegia.  Vital signs are stable.   .  Hypercoagulable labs are pending.  TCD bubble study is negative for right-to-left shunt and lower extremity venous Dopplers are negative for DVT.  ANA is negative Vitals:   11/04/22 2009 11/05/22 0041 11/05/22 0318 11/05/22 0855  BP: (!) 155/90 (!) 174/101 (!) 188/104 (!) 177/100  Pulse: 65 (!) 56 (!) 58 (!) 59  Resp: '18 18 18 18  '$ Temp: 98.3 F (36.8 C) 97.8 F (36.6 C) 98.3 F (36.8 C) 98.1 F (36.7 C)  TempSrc: Oral Oral Oral   SpO2: 100% 99% 100% 95%  Weight:      Height:       CBC:  Recent Labs  Lab 11/02/22 1201 11/02/22 1350 11/03/22 0219  WBC 6.3  --  5.0  NEUTROABS 4.3  --   --   HGB 13.6 13.9 13.0  HCT 40.3 41.0 39.1  MCV 90.2  --  90.1  PLT 245  --  621   Basic Metabolic Panel:  Recent Labs  Lab 11/02/22 1201 11/02/22 1350 11/03/22 0219  NA 136 140 137  K 3.5 3.5 3.5  CL 104 103 104  CO2 24  --  23  GLUCOSE 147* 144* 107*  BUN '13 14 9  '$ CREATININE 1.29* 1.20 1.13  CALCIUM 9.3  --  9.1   Lipid Panel:  Recent Labs  Lab 11/03/22 0219  CHOL 211*  TRIG 92  HDL 51  CHOLHDL 4.1  VLDL 18  LDLCALC 142*   HgbA1c:  Recent Labs  Lab 11/03/22 0219  HGBA1C 5.3   Urine Drug Screen:  Recent Labs  Lab 11/02/22 1235  LABOPIA NONE DETECTED  COCAINSCRNUR NONE DETECTED  LABBENZ NONE DETECTED  AMPHETMU NONE DETECTED  THCU NONE DETECTED  LABBARB NONE DETECTED    Alcohol Level  Recent Labs  Lab 11/02/22 1201  ETH <10    IMAGING past 24 hours VAS [place] TRANSCRANIAL DOPPLER W BUBBLES  Result Date: 11/05/2022  Transcranial Doppler with Bubble Patient Name:  Luis Roth  Date of Exam:   11/04/2022 Medical Rec #: [id number]     Accession #:    [id number] Date of Birth: [birth year]    Patient Gender: M Patient Age:   47 years Exam Location:  [facility] Procedure:      VAS [place] TRANSCRANIAL  [place] Referring Phys: Janine Ores --------------------------------------------------------------------------------  Indications: Stroke. Limitations: Patient movement Limitations for diagnostic windows: Unable to insonate right transtemporal window. Unable to insonate left transtemporal window. Comparison Study: No previous Performing Technologist: Jody Hill RVT, RDMS  Examination Guidelines: A complete evaluation includes B-mode imaging, spectral Doppler, color Doppler, and power Doppler as needed of all accessible portions of each vessel. Bilateral testing is considered an integral part of a complete examination. Limited examinations for reoccurring indications may be performed as noted.  Summary: No HITS at rest or during Valsalva. Negative transcranial Doppler Bubble study with no evidence of right to left intracardiac communication.  A vascular evaluation was performed. The left left vertebral artery was studied. An IV was inserted into the patient's right AC fossa. Verbal informed consent was obtained.  Negative TCD Bubble study *See table(s) above for TCD measurements and observations.  Diagnosing physician: Antony Contras MD Electronically signed by Antony Contras MD on 11/05/2022 at 11:21:16 AM.    Final    VAS [place] LOWER  EXTREMITY VENOUS (DVT)  Result Date: 11/04/2022  Lower Venous DVT Study Patient Name:  Luis Roth  Date of Exam:   11/04/2022 Medical Rec #: [id number]     Accession #:    [id number] Date of Birth: May 27, 1976    Patient Gender: M Patient Age:   47 years Exam Location:  [facility] Procedure:      VAS [place] LOWER EXTREMITY VENOUS (DVT) Referring Phys: Janine Ores --------------------------------------------------------------------------------  Indications: Stroke.  Comparison Study: No prior study on file Performing Technologist: Sharion Dove RVS  Examination Guidelines: A complete evaluation includes B-mode imaging, spectral Doppler, color Doppler, and power Doppler as  needed of all accessible portions of each vessel. Bilateral testing is considered an integral part of a complete examination. Limited examinations for reoccurring indications may be performed as noted. The reflux portion of the exam is performed with the patient in reverse Trendelenburg.  +---------+---------------+---------+-----------+----------+--------------+ RIGHT    CompressibilityPhasicitySpontaneityPropertiesThrombus Aging +---------+---------------+---------+-----------+----------+--------------+ CFV      Full           Yes      Yes                                 +---------+---------------+---------+-----------+----------+--------------+ SFJ      Full                                                        +---------+---------------+---------+-----------+----------+--------------+ FV Prox  Full                                                        +---------+---------------+---------+-----------+----------+--------------+ FV Mid   Full                                                        +---------+---------------+---------+-----------+----------+--------------+ FV DistalFull                                                        +---------+---------------+---------+-----------+----------+--------------+ PFV      Full                                                        +---------+---------------+---------+-----------+----------+--------------+ POP      Full           Yes      Yes                                 +---------+---------------+---------+-----------+----------+--------------+ PTV      Full                                                        +---------+---------------+---------+-----------+----------+--------------+  PERO     Full                                                        +---------+---------------+---------+-----------+----------+--------------+    +---------+---------------+---------+-----------+----------+--------------+ LEFT     CompressibilityPhasicitySpontaneityPropertiesThrombus Aging +---------+---------------+---------+-----------+----------+--------------+ CFV      Full           Yes      Yes                                 +---------+---------------+---------+-----------+----------+--------------+ SFJ      Full                                                        +---------+---------------+---------+-----------+----------+--------------+ FV Prox  Full                                                        +---------+---------------+---------+-----------+----------+--------------+ FV Mid   Full                                                        +---------+---------------+---------+-----------+----------+--------------+ FV DistalFull                                                        +---------+---------------+---------+-----------+----------+--------------+ PFV      Full                                                        +---------+---------------+---------+-----------+----------+--------------+ POP      Full           Yes      Yes                                 +---------+---------------+---------+-----------+----------+--------------+ PTV      Full                                                        +---------+---------------+---------+-----------+----------+--------------+ PERO     Full                                                        +---------+---------------+---------+-----------+----------+--------------+  Summary: BILATERAL: - No evidence of deep vein thrombosis seen in the lower extremities, bilaterally. -No evidence of popliteal cyst, bilaterally.   *See table(s) above for measurements and observations. Electronically signed by Monica Martinez MD on 11/04/2022 at 3:24:02 PM.    Final     PHYSICAL EXAM  Physical Exam  Constitutional: Appears  well-developed and well-nourished.  Middle-aged [place] male Cardiovascular: Normal rate and regular rhythm.  Respiratory: Effort normal, non-labored breathing  Neuro: Mental Status: Patient is awake, alert, oriented to person, place, month, year, and situation. Patient is able to give a clear and coherent history. No signs of aphasia or neglect Cranial Nerves: II: Visual Fields are full. PERRL III,IV, VI: EOMI  VII: Facial movement is symmetric resting and smiling VIII: Hearing is intact to voice X: Palate elevates symmetrically XI: Shoulder shrug is symmetric. XII: Tongue protrudes midline without atrophy or fasciculations.  Motor: Tone is normal. Bulk is normal.  Tone but no strength in left leg RUE 5/5, no drift LUE 2/5, able to lift slightly off bed, slight resistance to gravity. Hand grasp is weak as well RLE: 5/5, no drift LLE: 0/5, no movement spontaneously, to command or to noxious.  Increased tone in the left lower extremity Sensory: Sensation is symmetric to light touch  Cerebellar: FNF and HKS intact on the right, unable to perform on the left        ASSESSMENT/PLAN [name] is a 47 y.o. male with history of HTN who presented to the ED with new onset of left-sided weakness and inability to stand.    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 MRI  Patchy acute to early subacute infarcts throughout the right ACA distribution without hemorrhage or mass effect. Hypercoag workup pending  TEE pending TCD bubble study negative for right-to-left shunt 2D Echo EF 65-70%, LA mildly dilated LDL 142 HgbA1c 5.3 VTE prophylaxis - SCDs    Diet    Diet regular Room service appropriate? Yes; Fluid consistency: Thin   No antithrombotic prior to admission, now on aspirin 81 mg daily.  And Plavix 75 mg daily for 3 weeks and then aspirin alone Therapy recommendations:  pending  Disposition:  pending   Hypertension Permissive hypertension (OK if < 220/120) but gradually normalize Long-term BP goal normotensive  Hyperlipidemia LDL 142, goal < 70 Add atorvastatin '40mg'$   Continue statin at discharge   Other Stroke Risk Factors Obesity, Body mass index is 35.43 kg/m., BMI >/= 30 associated with increased stroke risk, recommend weight loss, diet and exercise as appropriate  Family hx stroke (brother)  Other [facility] day # 2    Patient presented with left leg weakness secondary to 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  Recommend  check TEE and main likely need prolonged cardiac monitoring at discharge as echocardiogram shows dilated left atrium.   Follow results of lab work for hypercoagulability  Mobilize out of bed.  Therapy recommend inpatient rehab.  Continue aspirin and Plavix for 3 weeks followed by aspirin alone greater than 50% time during this 25-minute visit was spent on counseling and coordination of care about his embolic stroke and left leg weakness and discussion about stroke evaluation  and treatment and answering questions. Antony Contras, MD Medical Director [facility] Pager: [phone] 11/05/2022 12:02 PM   To contact Stroke Continuity provider, please refer to [email or web address] After hours, contact General Neurology

## 2022-11-05 NOTE — Progress Notes (Signed)
Occupational Therapy Treatment Patient Details Name: Luis Roth MRN: [id number] DOB: 1976/04/22 Today's Date: 11/05/2022   History of present illness Pt is a 47 y/o M presenting to ED on 1/12 with BLE weakness, L >R. MRI revealing R ACA territory CVA. 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 PMH includes HTN.   OT comments  Patient continues to make steady progress towards goals in skilled OT session. Patient's session encompassed co-treat with PT to advance with functional mobility. Patient perseverating on going to the bathroom, with impulsivity noted throughout (max A of 2 to come to EOB, and mod A of 2 with stedy). Patient refusing to attempt to have BM on BSC (significant extra time given), despite education and redirection that Stedy cannot fit into bathroom. Patient frequently leaning back into reclined position on BSC requiring max cues to correct. Patient with poor insight and awareness of deficits throughout. OT continues to recommend AIR placement due to previous level of ability. OT will continue to follow.    Recommendations for follow up therapy are one component of a multi-disciplinary discharge planning process, led by the attending physician.  Recommendations may be updated based on patient status, additional functional criteria and insurance authorization.    Follow Up Recommendations  Acute inpatient rehab (3hours/day)     Assistance Recommended at Discharge Frequent or constant Supervision/Assistance  Patient can return home with the following  A lot of help with walking and/or transfers;A lot of help with bathing/dressing/bathroom;Assistance with cooking/housework;Direct supervision/assist for medications management;Direct supervision/assist for financial management;Assist for transportation;Help with stairs or ramp for entrance   Equipment Recommendations  Other (comment) (defer to next venue)     Recommendations for Other Services      Precautions / Restrictions Precautions Precautions: Fall Precaution Comments: L hemi Restrictions Weight Bearing Restrictions: No       Mobility Bed Mobility Overal bed mobility: Needs Assistance Bed Mobility: Rolling, Supine to Sit Rolling: Mod assist, +2 for physical assistance, +2 for safety/equipment   Supine to sit: Max assist, +2 for physical assistance, +2 for safety/equipment     General bed mobility comments: patient rolling to L, requiring signficant assist to come into sitting, often reaching for PTs hand to pull him up, and minimal effort noted to come into sitting despite max cues. Patient max A of 2 to come into sitting EOB then stating "that was easy" with minimal to no awareness of how much assist he recieved    Transfers Overall transfer level: Needs assistance Equipment used: Ambulation equipment used Transfers: Sit to/from Stand, Bed to chair/wheelchair/BSC Sit to Stand: Mod assist, +2 physical assistance, +2 safety/equipment     Step pivot transfers: Mod assist, +2 physical assistance, +2 safety/equipment     General transfer comment: mod of 2 to come into standing with steady, good use of R side, minimal activation noted in LUE with final sit<>stand, remains impulsive and requires increased cues in order to complete safely Transfer via Lift Equipment: Stedy   Balance Overall balance assessment: Needs assistance Sitting-balance support: Feet supported, Single extremity supported Sitting balance-Leahy Scale: Poor Sitting balance - Comments: posterior and L lateral lean Postural control: Left lateral lean, Posterior lean Standing balance support: During functional activity Standing balance-Leahy Scale: Poor Standing balance comment: reliant on external support                           ADL either performed or assessed with  clinical judgement   ADL Overall ADL's : Needs assistance/impaired      Grooming: Minimal assistance                   Toilet Transfer: Moderate assistance;+2 for physical assistance;+2 for safety/equipment;BSC/3in1 Toilet Transfer Details (indicate cue type and reason): use of stedy to complete transfer to [place] and Hygiene: Total assistance Toileting - Clothing Manipulation Details (indicate cue type and reason): unable to complete peri-care in standing from stedy     Functional mobility during ADLs: Moderate assistance;+2 for physical assistance General ADL Comments: Session focus on bathroom transfers and increasing activity tolerance    Extremity/Trunk Assessment Upper Extremity Assessment Upper Extremity Assessment: LUE deficits/detail LUE Deficits / Details: grossly 2/5, unable to shrug shoulders, increased movement/strength distally vs proximally LUE Coordination: decreased fine motor;decreased gross motor            Vision       Perception     Praxis      Cognition Arousal/Alertness: Awake/alert Behavior During Therapy: Impulsive Overall Cognitive Status: Impaired/Different from baseline Area of Impairment: Attention, Memory, Awareness, Safety/judgement, Following commands, Problem solving                   Current Attention Level: Focused Memory: Decreased short-term memory Following Commands: Follows one step commands inconsistently Safety/Judgement: Decreased awareness of safety, Decreased awareness of deficits Awareness: Emergent Problem Solving: Slow processing, Difficulty sequencing, Requires verbal cues, Requires tactile cues General Comments: Patient impulsive with all movement, stating "I need to go to the bathroom" however refusing to attempt to use the bathroom on BSC. Despite multiple attempts to redirect and providing extra time, patient unable to have BM on [facility]        Exercises      Shoulder Instructions       General Comments      Pertinent Vitals/ Pain        Pain Assessment Pain Assessment: No/denies pain  Home Living                                          Prior Functioning/Environment              Frequency  Min 2X/week        Progress Toward Goals  OT Goals(current goals can now be found in the care plan section)  Progress towards OT goals: Progressing toward goals  Acute Rehab OT Goals Patient Stated Goal: to go to the bathroom OT Goal Formulation: With patient Time For Goal Achievement: 11/17/22 Potential to Achieve Goals: [place] Discharge plan remains appropriate    Co-evaluation    PT/OT/SLP Co-Evaluation/Treatment: Yes Reason for Co-Treatment: Complexity of the patient's impairments (multi-system involvement);To address functional/ADL transfers;For patient/therapist safety   OT goals addressed during session: ADL's and self-care      AM-PAC OT "6 Clicks" Daily Activity     Outcome Measure   Help from another person eating meals?: A Little Help from another person taking care of personal grooming?: A Little Help from another person toileting, which includes using toliet, bedpan, or urinal?: Total Help from another person bathing (including washing, rinsing, drying)?: A Lot Help from another person to put on and taking off regular upper body clothing?: A Lot Help from another person to put on and taking off regular lower body clothing?: Total 6 Click Score: 12  End of Session Equipment Utilized During Treatment: Gait belt;Other (comment) Charlaine Dalton)  OT Visit Diagnosis: Unsteadiness on feet (R26.81);Other abnormalities of gait and mobility (R26.89);Muscle weakness (generalized) (M62.81);Other symptoms and signs involving cognitive function   Activity Tolerance Patient tolerated treatment well   Patient Left in chair;with call bell/phone within reach;with chair alarm set   Nurse Communication Mobility status        Time: 1400-1430 OT Time Calculation (min): 30 min  Charges: OT  General Charges $OT Visit: 1 Visit OT Treatments $Self Care/Home Management : 23-37 mins  [place]. Shanyce Daris, OTR/L Acute Rehabilitation Services [phone]   Ascencion Dike 11/05/2022, 3:00 PM

## 2022-11-05 NOTE — TOC Initial Note (Signed)
Transition of Care [facility]) - Initial/Assessment Note    Patient Details  Name: Luis Roth MRN: [id number] Date of Birth: [birth year]  Transition of Care [facility]) CM/SW Contact:    Ninfa Meeker, RN Phone Number: 11/05/2022, 12:54 PM  Clinical Narrative:                 Transition of Care Screening Note: Transition of Care [facility]) Department has reviewed patient and no TOC needs have been identified at this time. We will continue to monitor patient advancement through Interdisciplinary progressions and if new patient needs arise, please place a consult.        Patient Goals and CMS Choice            Expected Discharge Plan and Services                                              Prior Living Arrangements/Services                       Activities of Daily Living Home Assistive Devices/Equipment: None ADL Screening (condition at time of admission) Patient's cognitive ability adequate to safely complete daily activities?: Yes Is the patient deaf or have difficulty hearing?: No Does the patient have difficulty seeing, even when wearing glasses/contacts?: No Does the patient have difficulty concentrating, remembering, or making decisions?: No Patient able to express need for assistance with ADLs?: Yes Does the patient have difficulty dressing or bathing?: Yes Independently performs ADLs?: No Communication: Independent Dressing (OT): Needs assistance Is this a change from baseline?: Change from baseline, expected to last <3days Grooming: Needs assistance Is this a change from baseline?: Change from baseline, expected to last <3 days Feeding: Needs assistance Is this a change from baseline?: Change from baseline, expected to last <3 days Toileting: Needs assistance Is this a change from baseline?: Change from baseline, expected to last <3 days Walks in Home: Needs assistance Is this a change from baseline?: Change from baseline, expected to last <3  days Does the patient have difficulty walking or climbing stairs?: Yes Weakness of Legs: Left Weakness of Arms/Hands: Left  Permission Sought/Granted                  Emotional Assessment              Admission diagnosis:  Stroke ([place]) [I63.9] EKG abnormality [R94.31] Hypertensive urgency [I16.0] Acute CVA (cerebrovascular accident) [facility]) [I63.9] Patient Active Problem List   Diagnosis Date Noted   Primary hypertension 11/04/2022   Hyperlipidemia 11/03/2022   Right-sided partial anterior cerebral circulation infarction ([place]) 11/03/2022   Stroke ([place]) 11/02/2022   Abnormal ECG 11/02/2022   PCP:  Patient, [name] Pharmacy:   [place], [address] [place] [address] Phone: [phone] Fax: [phone]     Social Determinants of Health (SDOH) Social History: SDOH Screenings   Food Insecurity: No Food Insecurity (11/02/2022)  Housing: Low Risk  (11/02/2022)  Transportation Needs: No Transportation Needs (11/02/2022)  Utilities: Not At Risk (11/02/2022)  Tobacco Use: Low Risk  (11/02/2022)   SDOH Interventions:     Readmission Risk Interventions     No data to display

## 2022-11-05 NOTE — Progress Notes (Signed)
    [facility] has been requested to perform a transesophageal echocardiogram on Luis Roth for cryptogenic stroke.  After careful review of history and examination, the risks and benefits of transesophageal echocardiogram have been explained including risks of esophageal damage, perforation (1:10,000 risk), bleeding, pharyngeal hematoma as well as other potential complications associated with conscious sedation including aspiration, arrhythmia, respiratory failure and death. Alternatives to treatment were discussed, questions were answered. Patient is willing to proceed.   Lily Kocher PA-C 11/05/2022 1:55 PM

## 2022-11-05 NOTE — Progress Notes (Signed)
STROKE TEAM PROGRESS NOTE   INTERVAL HISTORY Seen in room.  He states his neurological exam is unchanged with dense left hemiplegia.  Vital signs are stable.   .  Hypercoagulable labs are pending.  TCD bubble study is negative for right-to-left shunt and lower extremity venous Dopplers are negative for DVT.  ANA is negative Vitals:   11/04/22 2009 11/05/22 0041 11/05/22 0318 11/05/22 0855  BP: (!) 155/90 (!) 174/101 (!) 188/104 (!) 177/100  Pulse: 65 (!) 56 (!) 58 (!) 59  Resp: '18 18 18 18  '$ Temp: 98.3 F (36.8 C) 97.8 F (36.6 C) 98.3 F (36.8 C) 98.1 F (36.7 C)  TempSrc: Oral Oral Oral   SpO2: 100% 99% 100% 95%  Weight:      Height:       CBC:  Recent Labs  Lab 11/02/22 1201 11/02/22 1350 11/03/22 0219  WBC 6.3  --  5.0  NEUTROABS 4.3  --   --   HGB 13.6 13.9 13.0  HCT 40.3 41.0 39.1  MCV 90.2  --  90.1  PLT 245  --  500   Basic Metabolic Panel:  Recent Labs  Lab 11/02/22 1201 11/02/22 1350 11/03/22 0219  NA 136 140 137  K 3.5 3.5 3.5  CL 104 103 104  CO2 24  --  23  GLUCOSE 147* 144* 107*  BUN '13 14 9  '$ CREATININE 1.29* 1.20 1.13  CALCIUM 9.3  --  9.1   Lipid Panel:  Recent Labs  Lab 11/03/22 0219  CHOL 211*  TRIG 92  HDL 51  CHOLHDL 4.1  VLDL 18  LDLCALC 142*   HgbA1c:  Recent Labs  Lab 11/03/22 0219  HGBA1C 5.3   Urine Drug Screen:  Recent Labs  Lab 11/02/22 1235  LABOPIA NONE DETECTED  COCAINSCRNUR NONE DETECTED  LABBENZ NONE DETECTED  AMPHETMU NONE DETECTED  THCU NONE DETECTED  LABBARB NONE DETECTED    Alcohol Level  Recent Labs  Lab 11/02/22 1201  ETH <10    IMAGING past 24 hours VAS [place] TRANSCRANIAL DOPPLER W BUBBLES  Result Date: 11/05/2022  Transcranial Doppler with Bubble Patient Name:  Luis Roth  Date of Exam:   11/04/2022 Medical Rec #: [id number]     Accession #:    [id number] Date of Birth: Apr 22, 1976    Patient Gender: M Patient Age:   47 years Exam Location:  [facility] Procedure:      VAS [place] TRANSCRANIAL  [place] Referring Phys: Janine Ores --------------------------------------------------------------------------------  Indications: Stroke. Limitations: Patient movement Limitations for diagnostic windows: Unable to insonate right transtemporal window. Unable to insonate left transtemporal window. Comparison Study: No previous Performing Technologist: Jody Hill RVT, RDMS  Examination Guidelines: A complete evaluation includes B-mode imaging, spectral Doppler, color Doppler, and power Doppler as needed of all accessible portions of each vessel. Bilateral testing is considered an integral part of a complete examination. Limited examinations for reoccurring indications may be performed as noted.  Summary: No HITS at rest or during Valsalva. Negative transcranial Doppler Bubble study with no evidence of right to left intracardiac communication.  A vascular evaluation was performed. The left left vertebral artery was studied. An IV was inserted into the patient's right AC fossa. Verbal informed consent was obtained.  Negative TCD Bubble study *See table(s) above for TCD measurements and observations.  Diagnosing physician: Antony Contras MD Electronically signed by Antony Contras MD on 11/05/2022 at 11:21:16 AM.    Final    VAS [place] LOWER  EXTREMITY VENOUS (DVT)  Result Date: 11/04/2022  Lower Venous DVT Study Patient Name:  Luis Roth  Date of Exam:   11/04/2022 Medical Rec #: [id number]     Accession #:    [id number] Date of Birth: 09/10/1976    Patient Gender: M Patient Age:   47 years Exam Location:  [facility] Procedure:      VAS [place] LOWER EXTREMITY VENOUS (DVT) Referring Phys: Janine Ores --------------------------------------------------------------------------------  Indications: Stroke.  Comparison Study: No prior study on file Performing Technologist: Sharion Dove RVS  Examination Guidelines: A complete evaluation includes B-mode imaging, spectral Doppler, color Doppler, and power Doppler as  needed of all accessible portions of each vessel. Bilateral testing is considered an integral part of a complete examination. Limited examinations for reoccurring indications may be performed as noted. The reflux portion of the exam is performed with the patient in reverse Trendelenburg.  +---------+---------------+---------+-----------+----------+--------------+ RIGHT    CompressibilityPhasicitySpontaneityPropertiesThrombus Aging +---------+---------------+---------+-----------+----------+--------------+ CFV      Full           Yes      Yes                                 +---------+---------------+---------+-----------+----------+--------------+ SFJ      Full                                                        +---------+---------------+---------+-----------+----------+--------------+ FV Prox  Full                                                        +---------+---------------+---------+-----------+----------+--------------+ FV Mid   Full                                                        +---------+---------------+---------+-----------+----------+--------------+ FV DistalFull                                                        +---------+---------------+---------+-----------+----------+--------------+ PFV      Full                                                        +---------+---------------+---------+-----------+----------+--------------+ POP      Full           Yes      Yes                                 +---------+---------------+---------+-----------+----------+--------------+ PTV      Full                                                        +---------+---------------+---------+-----------+----------+--------------+  PERO     Full                                                        +---------+---------------+---------+-----------+----------+--------------+    +---------+---------------+---------+-----------+----------+--------------+ LEFT     CompressibilityPhasicitySpontaneityPropertiesThrombus Aging +---------+---------------+---------+-----------+----------+--------------+ CFV      Full           Yes      Yes                                 +---------+---------------+---------+-----------+----------+--------------+ SFJ      Full                                                        +---------+---------------+---------+-----------+----------+--------------+ FV Prox  Full                                                        +---------+---------------+---------+-----------+----------+--------------+ FV Mid   Full                                                        +---------+---------------+---------+-----------+----------+--------------+ FV DistalFull                                                        +---------+---------------+---------+-----------+----------+--------------+ PFV      Full                                                        +---------+---------------+---------+-----------+----------+--------------+ POP      Full           Yes      Yes                                 +---------+---------------+---------+-----------+----------+--------------+ PTV      Full                                                        +---------+---------------+---------+-----------+----------+--------------+ PERO     Full                                                        +---------+---------------+---------+-----------+----------+--------------+  Summary: BILATERAL: - No evidence of deep vein thrombosis seen in the lower extremities, bilaterally. -No evidence of popliteal cyst, bilaterally.   *See table(s) above for measurements and observations. Electronically signed by Monica Martinez MD on 11/04/2022 at 3:24:02 PM.    Final     PHYSICAL EXAM  Physical Exam  Constitutional: Appears  well-developed and well-nourished.  Middle-aged [place] male Cardiovascular: Normal rate and regular rhythm.  Respiratory: Effort normal, non-labored breathing  Neuro: Mental Status: Patient is awake, alert, oriented to person, place, month, year, and situation. Patient is able to give a clear and coherent history. No signs of aphasia or neglect Cranial Nerves: II: Visual Fields are full. PERRL III,IV, VI: EOMI  VII: Facial movement is symmetric resting and smiling VIII: Hearing is intact to voice X: Palate elevates symmetrically XI: Shoulder shrug is symmetric. XII: Tongue protrudes midline without atrophy or fasciculations.  Motor: Tone is normal. Bulk is normal.  Tone but no strength in left leg RUE 5/5, no drift LUE 2/5, able to lift slightly off bed, slight resistance to gravity. Hand grasp is weak as well RLE: 5/5, no drift LLE: 0/5, no movement spontaneously, to command or to noxious.  Increased tone in the left lower extremity Sensory: Sensation is symmetric to light touch  Cerebellar: FNF and HKS intact on the right, unable to perform on the left        ASSESSMENT/PLAN [name] is a 47 y.o. male with history of HTN who presented to the ED with new onset of left-sided weakness and inability to stand.    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 MRI  Patchy acute to early subacute infarcts throughout the right ACA distribution without hemorrhage or mass effect. Hypercoag workup pending  TEE pending TCD bubble study negative for right-to-left shunt 2D Echo EF 65-70%, LA mildly dilated LDL 142 HgbA1c 5.3 VTE prophylaxis - SCDs    Diet    Diet regular Room service appropriate? Yes; Fluid consistency: Thin   No antithrombotic prior to admission, now on aspirin 81 mg daily.  And Plavix 75 mg daily for 3 weeks and then aspirin alone Therapy recommendations:  pending  Disposition:  pending   Hypertension Permissive hypertension (OK if < 220/120) but gradually normalize Long-term BP goal normotensive  Hyperlipidemia LDL 142, goal < 70 Add atorvastatin '40mg'$   Continue statin at discharge   Other Stroke Risk Factors Obesity, Body mass index is 35.43 kg/m., BMI >/= 30 associated with increased stroke risk, recommend weight loss, diet and exercise as appropriate  Family hx stroke (brother)  Other [facility] day # 2    Patient presented with left leg weakness secondary to 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  Recommend  check TEE and main likely need prolonged cardiac monitoring at discharge as echocardiogram shows dilated left atrium.   Follow results of lab work for hypercoagulability  Mobilize out of bed.  Therapy recommend inpatient rehab.  Continue aspirin and Plavix for 3 weeks followed by aspirin alone greater than 50% time during this 25-minute visit was spent on counseling and coordination of care about his embolic stroke and left leg weakness and discussion about stroke evaluation  and treatment and answering questions. Antony Contras, MD Medical Director [facility] Pager: [phone] 11/05/2022 12:02 PM   To contact Stroke Continuity provider, please refer to [email or web address] After hours, contact General Neurology

## 2022-11-05 NOTE — Progress Notes (Addendum)
Inpatient Rehab Admissions Coordinator:    I spoke with pt. To discuss potential admit. States that he is interested, but is not sure he will have adequate support at home. Also states that he lives in a second floor appartment. I spoke with Valarie Merino, one of Pt.'s roommates and he states that he and Purcell Nails (Pt.'s other roommate) are just renting rooms in Pt.'s apartment and not able to provide caregiving assistance as they are both student. Pt. Indicates that he does not have other local sources of support at d/c. Considering Pt. Is currently mod+2-max_2 with transfers, I do not think he will be able to reach mod I goals during short stint on CIR. Recommend TOC look at other rehab venues for Pt., such as SNF.   Addendum, confirmed with pt. He has no other support aside from roommates/friends who are not able to provide care. CIR will sign off.   Clemens Catholic, [place], [place] Admissions Coordinator  [phone] ([place]) [phone] (office)

## 2022-11-05 NOTE — Progress Notes (Addendum)
Physical Therapy Treatment Patient Details Name: Luis Roth MRN: [id number] DOB: May 07, 1976 Today's Date: 11/05/2022   History of Present Illness Pt is a 47 y/o M presenting to ED on 1/12 with BLE weakness, L >R. MRI revealing R ACA territory CVA. 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 PMH includes HTN.    PT Comments    Patient seen with OT to work on functional mobility. Utilized stedy with good success. Pt remains impulsive and with decr awareness of his deficits (thought he could walk to bathroom). Noted pt does not have caregiver support on discharge and is no longer an AIR candidate. Discharge plan updated to SNF.    Recommendations for follow up therapy are one component of a multi-disciplinary discharge planning process, led by the attending physician.  Recommendations may be updated based on patient status, additional functional criteria and insurance authorization.  Follow Up Recommendations  Skilled nursing-short term rehab (<3 hours/day)     Assistance Recommended at Discharge Frequent or constant Supervision/Assistance  Patient can return home with the following A lot of help with walking and/or transfers;A lot of help with bathing/dressing/bathroom;Assistance with cooking/housework;Assist for transportation;Help with stairs or ramp for entrance   Equipment Recommendations  Other (comment) (TBD)    Recommendations for Other Services       Precautions / Restrictions Precautions Precautions: Fall Precaution Comments: L hemi Restrictions Weight Bearing Restrictions: No     Mobility  Bed Mobility Overal bed mobility: Needs Assistance Bed Mobility: Rolling, Sidelying to Sit Rolling: Mod assist, +2 for physical assistance, +2 for safety/equipment Sidelying to sit: Max assist, +2 for physical assistance       General bed mobility comments: patient rolling to L, requiring signficant assist  to come into sitting, often reaching for PTs hand to pull him up, and minimal effort noted to come into sitting despite max cues. Patient max A of 2 to come into sitting EOB then stating "that was easy" with minimal to no awareness of how much assist he recieved    Transfers Overall transfer level: Needs assistance Equipment used: Ambulation equipment used Transfers: Sit to/from Stand, Bed to chair/wheelchair/BSC Sit to Stand: Mod assist, +2 physical assistance, +2 safety/equipment   Step pivot transfers: Mod assist, +2 physical assistance, +2 safety/equipment       General transfer comment: mod of 2 to come into standing with steady, good use of R side, minimal activation noted in LUE with final sit<>stand, remains impulsive and requires increased cues in order to complete safely Transfer via Lift Equipment: Stedy  Ambulation/Gait             Pre-gait activities: sit to stand x 4 with stedy     Stairs             Wheelchair Mobility    Modified Rankin (Stroke Patients Only) Modified Rankin (Stroke Patients Only) Pre-Morbid Rankin Score: No symptoms Modified Rankin: Severe disability     Balance Overall balance assessment: Needs assistance Sitting-balance support: Feet supported, Single extremity supported Sitting balance-Leahy Scale: Poor Sitting balance - Comments: posterior and L lateral lean Postural control: Left lateral lean, Posterior lean Standing balance support: During functional activity Standing balance-Leahy Scale: Poor Standing balance comment: reliant on external support                            Cognition Arousal/Alertness: Awake/alert Behavior During Therapy: Impulsive Overall Cognitive Status: Impaired/Different from  baseline Area of Impairment: Attention, Memory, Awareness, Safety/judgement, Following commands, Problem solving                   Current Attention Level: Focused Memory: Decreased short-term  memory Following Commands: Follows one step commands inconsistently Safety/Judgement: Decreased awareness of safety, Decreased awareness of deficits Awareness: Emergent Problem Solving: Slow processing, Difficulty sequencing, Requires verbal cues, Requires tactile cues General Comments: Patient impulsive with all movement, stating "I need to go to the bathroom" however refusing to attempt to use the bathroom on BSC (thinking he can walk into bathroom). Despite multiple attempts to redirect and providing extra time, patient unable to have BM on [facility]        Exercises      General Comments        Pertinent Vitals/Pain Pain Assessment Pain Assessment: No/denies pain    Home Living                          Prior Function            PT Goals (current goals can now be found in the care plan section) Acute Rehab PT Goals Patient Stated Goal: back to PLOF Time For Goal Achievement: 11/17/22 Potential to Achieve Goals: Good Progress towards PT goals: Progressing toward goals    Frequency    Min 3X/week      PT Plan Discharge plan needs to be updated;Frequency needs to be updated    Co-evaluation PT/OT/SLP Co-Evaluation/Treatment: Yes Reason for Co-Treatment: Complexity of the patient's impairments (multi-system involvement);For patient/therapist safety PT goals addressed during session: Mobility/safety with mobility;Balance;Proper use of DME OT goals addressed during session: ADL's and self-care      AM-PAC PT "6 Clicks" Mobility   Outcome Measure  Help needed turning from your back to your side while in a flat bed without using bedrails?: Total Help needed moving from lying on your back to sitting on the side of a flat bed without using bedrails?: Total Help needed moving to and from a bed to a chair (including a wheelchair)?: Total Help needed standing up from a chair using your arms (e.g., wheelchair or bedside chair)?: Total Help needed to walk in  hospital room?: Total Help needed climbing 3-5 steps with a railing? : Total 6 Click Score: 6    End of Session Equipment Utilized During Treatment: Gait belt Activity Tolerance: Patient tolerated treatment well Patient left: in chair;with call bell/phone within reach;with chair alarm set Nurse Communication: Mobility status PT Visit Diagnosis: Unsteadiness on feet (R26.81);Hemiplegia and hemiparesis;Other abnormalities of gait and mobility (R26.89) Hemiplegia - Right/Left: Left Hemiplegia - dominant/non-dominant: Non-dominant Hemiplegia - caused by: Cerebral infarction     Time: 1400-1430 PT Time Calculation (min) (ACUTE ONLY): 30 min  Charges:    No charge (significant amount of time with pt on [facility] during session)                     Arby Barrette, PT Acute Rehabilitation Services  Office [phone]    Rexanne Mano 11/05/2022, 4:16 PM

## 2022-11-05 NOTE — TOC Progression Note (Signed)
Transition of Care [facility]) - Progression Note    Patient Details  Name: Luis Roth MRN: [id number] Date of Birth: 1976/03/04  [facility] CM/SW Contact  Jinger Neighbors, [place] Phone Number: 11/05/2022, 3:59 PM  Clinical Narrative:     CSW learned late in the day pt will not be going to CIR and will need SNF placement. CSW attempted to meet with pt two times and both times pt on bed side commode. CSW will attempt work up 11/06/2022       Expected Discharge Plan and Services                                               Social Determinants of Health (SDOH) Interventions SDOH Screenings   Food Insecurity: No Food Insecurity (11/02/2022)  Housing: Low Risk  (11/02/2022)  Transportation Needs: No Transportation Needs (11/02/2022)  Utilities: Not At Risk (11/02/2022)  Tobacco Use: Low Risk  (11/02/2022)    Readmission Risk Interventions     No data to display

## 2022-11-06 ENCOUNTER — Encounter (HOSPITAL_COMMUNITY): Payer: Self-pay | Admitting: Student

## 2022-11-06 ENCOUNTER — Inpatient Hospital Stay (HOSPITAL_COMMUNITY): Payer: 59 | Admitting: Anesthesiology

## 2022-11-06 ENCOUNTER — Encounter (HOSPITAL_COMMUNITY)
Admission: EM | Disposition: A | Discharge status: Skilled Nursing Facility | Payer: Self-pay | Source: Home / Self Care | Attending: Family Medicine

## 2022-11-06 ENCOUNTER — Inpatient Hospital Stay (HOSPITAL_COMMUNITY): Payer: 59

## 2022-11-06 DIAGNOSIS — I6339 Cerebral infarction due to thrombosis of other cerebral artery: Secondary | ICD-10-CM | POA: Diagnosis not present

## 2022-11-06 DIAGNOSIS — I517 Cardiomegaly: Secondary | ICD-10-CM

## 2022-11-06 DIAGNOSIS — I10 Essential (primary) hypertension: Secondary | ICD-10-CM | POA: Diagnosis not present

## 2022-11-06 DIAGNOSIS — I639 Cerebral infarction, unspecified: Secondary | ICD-10-CM

## 2022-11-06 DIAGNOSIS — I340 Nonrheumatic mitral (valve) insufficiency: Secondary | ICD-10-CM | POA: Diagnosis not present

## 2022-11-06 HISTORY — PX: TEE WITHOUT CARDIOVERSION: SHX5443

## 2022-11-06 HISTORY — PX: BUBBLE STUDY: SHX6837

## 2022-11-06 LAB — LUPUS ANTICOAGULANT PANEL
DRVVT: 41.3 s (ref 0.0–47.0)
PTT Lupus Anticoagulant: 37.6 s (ref 0.0–43.5)

## 2022-11-06 LAB — PROTEIN S ACTIVITY: Protein S Activity: 117 % (ref 63–140)

## 2022-11-06 LAB — CARDIOLIPIN ANTIBODIES, IGG, IGM, IGA
Anticardiolipin IgA: <9 APL U/mL (ref 0–11)
Anticardiolipin IgG: <9 GPL U/mL (ref 0–14)
Anticardiolipin IgM: <9 MPL U/mL (ref 0–12)

## 2022-11-06 LAB — PROTEIN S, TOTAL: Protein S Ag, Total: 110 % (ref 60–150)

## 2022-11-06 LAB — ECHO TEE

## 2022-11-06 LAB — PROTEIN C ACTIVITY: Protein C Activity: 132 % (ref 73–180)

## 2022-11-06 SURGERY — ECHOCARDIOGRAM, TRANSESOPHAGEAL
Anesthesia: Monitor Anesthesia Care

## 2022-11-06 MED ORDER — PROPOFOL 10 MG/ML IV BOLUS
INTRAVENOUS | Status: DC | PRN
  Administered 2022-11-06: 30 mg via INTRAVENOUS

## 2022-11-06 MED ORDER — LOSARTAN POTASSIUM 50 MG PO TABS
100.0000 mg | ORAL_TABLET | Freq: Every day | ORAL | Status: DC
  Administered 2022-11-07 – 2022-11-12 (×6): 100 mg via ORAL
  Filled 2022-11-06 (×6): qty 2

## 2022-11-06 MED ORDER — PROPOFOL 500 MG/50ML IV EMUL
INTRAVENOUS | Status: DC | PRN
  Administered 2022-11-06: 125 ug/kg/min via INTRAVENOUS

## 2022-11-06 MED ORDER — LACTATED RINGERS IV SOLN
INTRAVENOUS | Status: AC | PRN
  Administered 2022-11-06: 1000 mL via INTRAVENOUS

## 2022-11-06 MED ORDER — LACTATED RINGERS IV SOLN: INTRAVENOUS | Status: DC | PRN

## 2022-11-06 NOTE — Progress Notes (Signed)
STROKE TEAM PROGRESS NOTE   INTERVAL HISTORY Seen in endoscopy room.  He states his neurological exam is unchanged with dense left hemiplegia.  Vital signs are stable.   .  Hypercoagulable labs are pending.  TEE is negative for PFO or clot. Vitals:   11/06/22 1113 11/06/22 1400 11/06/22 1410 11/06/22 1420  BP: (!) 155/98 129/87 (!) 122/91 120/88  Pulse: 67 85 82 71  Resp: (!) '21 14 20 '$ (!) 21  Temp: (!) 97.4 F (36.3 C)     TempSrc: Temporal     SpO2: 95% 100% 96% 93%  Weight: 90 kg     Height:       CBC:  Recent Labs  Lab 11/02/22 1201 11/02/22 1350 11/03/22 0219  WBC 6.3  --  5.0  NEUTROABS 4.3  --   --   HGB 13.6 13.9 13.0  HCT 40.3 41.0 39.1  MCV 90.2  --  90.1  PLT 245  --  631   Basic Metabolic Panel:  Recent Labs  Lab 11/02/22 1201 11/02/22 1350 11/03/22 0219  NA 136 140 137  K 3.5 3.5 3.5  CL 104 103 104  CO2 24  --  23  GLUCOSE 147* 144* 107*  BUN '13 14 9  '$ CREATININE 1.29* 1.20 1.13  CALCIUM 9.3  --  9.1   Lipid Panel:  Recent Labs  Lab 11/03/22 0219  CHOL 211*  TRIG 92  HDL 51  CHOLHDL 4.1  VLDL 18  LDLCALC 142*   HgbA1c:  Recent Labs  Lab 11/03/22 0219  HGBA1C 5.3   Urine Drug Screen:  Recent Labs  Lab 11/02/22 1235  LABOPIA NONE DETECTED  COCAINSCRNUR NONE DETECTED  LABBENZ NONE DETECTED  AMPHETMU NONE DETECTED  THCU NONE DETECTED  LABBARB NONE DETECTED    Alcohol Level  Recent Labs  Lab 11/02/22 1201  ETH <10    IMAGING past 24 hours ECHO TEE  Result Date: 11/06/2022    TRANSESOPHOGEAL ECHO REPORT   Patient Name:   CALVEN GILKES Date of Exam: 11/06/2022 Medical Rec #:  [id number]    Height:       63.0 in Accession #:    [id number]   Weight:       198.4 lb Date of Birth:  12-12-1975   BSA:          1.927 m Patient Age:    47 years     BP:           155/98 mmHg Patient Gender: M            HR:           69 bpm. Exam Location:  Inpatient Procedure: Transesophageal Echo, Cardiac Doppler and Color Doppler Indications:    Stroke   History:        Patient has prior history of Echocardiogram examinations, most                 recent 11/02/2022. Risk Factors:Hypertension.  Sonographer:    Eartha Inch Referring Phys: [id number] Lily Kocher PROCEDURE: After discussion of the risks and benefits of a TEE, an informed consent was obtained from the patient. TEE procedure time was 20 minutes. The transesophogeal probe was passed without difficulty through the esophogus of the patient. Imaged were obtained with the patient in a left lateral decubitus position. Sedation performed by different physician. The patient was monitored while under deep sedation. Anesthestetic sedation was provided intravenously by Anesthesiology: 333.'75mg'$  of Propofol. Image quality was adequate. The  patient's vital signs; including heart rate, blood pressure, and oxygen saturation; remained stable throughout the procedure. The patient developed no complications during the procedure.  IMPRESSIONS  1. Left ventricular ejection fraction, by estimation, is 60 to 65%. The left ventricle has normal function.  2. Right ventricular systolic function is normal. The right ventricular size is normal.  3. No left atrial/left atrial appendage thrombus was detected. The LAA emptying velocity was 95 cm/s.  4. The mitral valve is normal in structure. Trivial mitral valve regurgitation. No evidence of mitral stenosis.  5. The aortic valve is tricuspid. Aortic valve regurgitation is trivial. No aortic stenosis is present.  6. Agitated saline contrast bubble study was negative, with no evidence of any interatrial shunt. FINDINGS  Left Ventricle: Left ventricular ejection fraction, by estimation, is 60 to 65%. The left ventricle has normal function. The left ventricular internal cavity size was normal in size. Right Ventricle: The right ventricular size is normal. No increase in right ventricular wall thickness. Right ventricular systolic function is normal. Left Atrium: Left atrial size was  normal in size. No left atrial/left atrial appendage thrombus was detected. The LAA emptying velocity was 95 cm/s. Right Atrium: Right atrial size was normal in size. Pericardium: There is no evidence of pericardial effusion. Mitral Valve: The mitral valve is normal in structure. Trivial mitral valve regurgitation. No evidence of mitral valve stenosis. Tricuspid Valve: The tricuspid valve is normal in structure. Tricuspid valve regurgitation is not demonstrated. No evidence of tricuspid stenosis. Aortic Valve: The aortic valve is tricuspid. Aortic valve regurgitation is trivial. No aortic stenosis is present. Pulmonic Valve: The pulmonic valve was normal in structure. Pulmonic valve regurgitation is not visualized. No evidence of pulmonic stenosis. Aorta: The aortic root is normal in size and structure. There is minimal (Grade I) layered plaque. Venous: The left upper pulmonary vein, left lower pulmonary vein, right upper pulmonary vein and right lower pulmonary vein are normal. IAS/Shunts: No atrial level shunt detected by color flow Doppler. Agitated saline contrast bubble study was negative, with no evidence of any interatrial shunt. Additional Comments: Spectral Doppler performed. Berniece Salines DO Electronically signed by Berniece Salines DO Signature Date/Time: 11/06/2022/2:18:43 PM    Final     PHYSICAL EXAM  Physical Exam  Constitutional: Appears well-developed and well-nourished.  Middle-aged [place] male Cardiovascular: Normal rate and regular rhythm.  Respiratory: Effort normal, non-labored breathing  Neuro: Mental Status: Patient is awake, alert, oriented to person, place, month, year, and situation. Patient is able to give a clear and coherent history. No signs of aphasia or neglect Cranial Nerves: II: Visual Fields are full. PERRL III,IV, VI: EOMI  VII: Facial movement is symmetric resting and smiling VIII: Hearing is intact to voice X: Palate elevates symmetrically XI: Shoulder shrug  is symmetric. XII: Tongue protrudes midline without atrophy or fasciculations.  Motor: Tone is normal. Bulk is normal.  Tone but no strength in left leg RUE 5/5, no drift LUE 2/5, able to lift slightly off bed, slight resistance to gravity. Hand grasp is weak as well RLE: 5/5, no drift LLE: 0/5, no movement spontaneously, to command or to noxious.  Increased tone in the left lower extremity Sensory: Sensation is symmetric to light touch  Cerebellar: FNF and HKS intact on the right, unable to perform on the left        ASSESSMENT/PLAN Mr. Emet Rafanan is a 47 y.o. male with history of HTN who presented to the ED with new onset of left-sided weakness  and inability to stand.    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 MRI  Patchy acute to early subacute infarcts throughout the right ACA distribution without hemorrhage or mass effect. Hypercoag workup pending  TEE negative for PFO or clot.  Severe left ventricular hypertrophy TCD bubble study negative for right-to-left shunt 2D Echo EF 65-70%, LA mildly dilated LDL 142 HgbA1c 5.3 VTE prophylaxis - SCDs    Diet   Diet regular Room service appropriate? Yes; Fluid consistency: Thin   No antithrombotic prior to admission, now on aspirin 81 mg daily.  And Plavix 75 mg daily for 3 weeks and then aspirin alone Therapy recommendations:  pending  Disposition:  pending   Hypertension Permissive hypertension (OK if < 220/120) but gradually normalize Long-term BP goal normotensive  Hyperlipidemia LDL 142, goal < 70 Add atorvastatin '40mg'$   Continue statin at discharge   Other Stroke Risk Factors Obesity, Body mass  index is 35.15 kg/m., BMI >/= 30 associated with increased stroke risk, recommend weight loss, diet and exercise as appropriate  Family hx stroke (brother)  Other [facility] day # 3    Patient presented with left leg weakness secondary to 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  Recommend   prolonged cardiac monitoring with 30-day heart monitor at discharge as echocardiogram shows dilated left atrium.   Follow results of lab work for hypercoagulability  .  Continue aspirin and Plavix for 3 weeks followed by aspirin alone .stroke team will sign off.  Follow-up as an outpatient in the stroke clinic with me 2 months.  Greater than 50% time during this 25-minute visit was spent on counseling and coordination of care about his embolic stroke and left leg weakness and discussion about stroke evaluation and treatment and answering questions. Antony Contras, MD Medical Director [facility] St[facility] Pager: [phone] 11/06/2022 3:08 PM   To contact Stroke Continuity provider, please refer to [email or web address] After hours, contact General Neurology

## 2022-11-06 NOTE — Anesthesia Postprocedure Evaluation (Signed)
Anesthesia Post Note  Patient: Luis Roth  Procedure(s) Performed: TRANSESOPHAGEAL ECHOCARDIOGRAM (TEE) BUBBLE STUDY     Patient location during evaluation: Endoscopy Anesthesia Type: MAC Level of consciousness: awake and alert Pain management: pain level controlled Vital Signs Assessment: post-procedure vital signs reviewed and stable Respiratory status: spontaneous breathing, nonlabored ventilation, respiratory function stable and patient connected to nasal cannula oxygen Cardiovascular status: blood pressure returned to baseline and stable Postop Assessment: no apparent nausea or vomiting Anesthetic complications: no   No notable events documented.  Last Vitals:  Vitals:   11/06/22 1410 11/06/22 1420  BP: (!) 122/91 120/88  Pulse: 82 71  Resp: 20 (!) 21  Temp:    SpO2: 96% 93%    Last Pain:  Vitals:   11/06/22 1420  TempSrc:   PainSc: 0-No pain                 Joshus Rogan DANIEL

## 2022-11-06 NOTE — CV Procedure (Signed)
    TRANSESOPHAGEAL ECHOCARDIOGRAM   NAME:  Luis Roth    MRN: [id number] DOB:  04/28/1976    ADMIT DATE: 11/02/2022  INDICATIONS: CVA  PROCEDURE:   Informed consent was obtained prior to the procedure. The risks, benefits and alternatives for the procedure were discussed and the patient comprehended these risks.  Risks include, but are not limited to, cough, sore throat, vomiting, nausea, somnolence, esophageal and stomach trauma or perforation, bleeding, low blood pressure, aspiration, pneumonia, infection, trauma to the teeth and death.    Procedural time out performed. The oropharynx was anesthetized with viscous lidocaine.  Anesthesia was administered by the anaesthesilogy team to achieve and maintain moderate to deep conscious sedation.  The patient's heart rate, blood pressure, and oxygen saturation were monitored continuously during the procedure.  The transesophageal probe was inserted in the esophagus and stomach without difficulty and multiple views were obtained.   The patient tolerated the procedure well.  COMPLICATIONS:    There were no immediate complications.  KEY FINDINGS:  Normal LVEF, Severe LVH, No apparent left atrial appendage clot, Agitated saline study was negative. Trivial MR..  Full report to follow. Further management per primary team.   Berniece Salines, DO [place]  1:53 PM

## 2022-11-06 NOTE — Progress Notes (Signed)
  Echocardiogram Echocardiogram Transesophageal has been performed.  Luis Roth 11/06/2022, 1:52 PM

## 2022-11-06 NOTE — Progress Notes (Addendum)
     Daily Progress Note Intern Pager: [phone]  Patient name: Luis Roth Medical record number: [id number] Date of birth: [birth year] Age: 47 y.o. Gender: male  Primary Care Provider: Patient, [name] Consultants: Neurology Code Status: Full code   Pt Overview and Major Events to Date:  1/12: Admitted  1/16: TEE planned today   Assessment and Plan:  Luis Roth is a 47yo M w/ hx of HTN that is admitted for subacute ischemic stroke of R ACA.   * Stroke ([place]) Appears to be improving, grip strength on the left hand is better today.  - Atorvastatin '80mg'$  daily - Plavix 75 mg for 3 weeks - ASA '81mg'$  daily - amlodipine 10 mg (started 1/14) - increase Losartan to 100 mg (started 1/16) - Neurology consulted, appreciate recommendations  - TEE w/ cardiology planned for today  - PT/OT - Fall precautions - Pending SNF, CIR no longer an option due to no care at home    FEN/GI: regular PPx: Lovenox  Dispo:SNF today. Barriers include ongoing medical workup.   Subjective:  NAEO, resting comfortably. No new complaints.   Objective: Temp:  [97.7 F (36.5 C)-98.2 F (36.8 C)] 98.2 F (36.8 C) (01/16 0740) Pulse Rate:  [55-67] 64 (01/16 0740) Resp:  [17-18] 18 (01/16 0740) BP: (137-174)/(91-117) 154/106 (01/16 0740) SpO2:  [100 %] 100 % (01/16 0740) Physical Exam: Well-appearing, no acute distress Cardio: Regular rate, regular rhythm, no murmurs on exam. Pulm: Clear, no wheezing, no crackles. No increased work of breathing Abdominal: bowel sounds present, soft, non-tender, non-distended Extremities: no peripheral edema  Neuro: alert and oriented x3, speech normal in content, no facial asymmetry, strength intact on the right, severely decreased on the left.    Laboratory: Most recent CBC Lab Results  Component Value Date   WBC 5.0 11/03/2022   HGB 13.0 11/03/2022   HCT 39.1 11/03/2022   MCV 90.1 11/03/2022   PLT 218 11/03/2022   Most recent BMP    Latest Ref Rng & Units  11/03/2022    2:19 AM  BMP  Glucose 70 - 99 mg/dL 107   BUN 6 - 20 mg/dL 9   Creatinine 0.61 - 1.24 mg/dL 1.13   Sodium 135 - 145 mmol/L 137   Potassium 3.5 - 5.1 mmol/L 3.5   Chloride 98 - 111 mmol/L 104   CO2 22 - 32 mmol/L 23   Calcium 8.9 - 10.3 mg/dL 9.1     Darci Current, DO 11/06/2022, 11:00 AM  PGY-1, [place] Intern pager: [phone], text pages welcome Secure chat group [place] \

## 2022-11-06 NOTE — Plan of Care (Signed)
  Problem: Education: Goal: Knowledge of General Education information will improve Description: Including pain rating scale, medication(s)/side effects and non-pharmacologic comfort measures Outcome: Progressing   Problem: Nutrition: Goal: Adequate nutrition will be maintained Outcome: Progressing   Problem: Coping: Goal: Level of anxiety will decrease Outcome: Progressing   Problem: Pain Managment: Goal: General experience of comfort will improve Outcome: Progressing   Problem: Education: Goal: Knowledge of disease or condition will improve Outcome: Progressing

## 2022-11-06 NOTE — NC FL2 (Signed)
[place] LEVEL OF CARE FORM     IDENTIFICATION  Patient Name: Luis Roth Birthdate: 02-16-1976 Sex: male Admission Date (Current Location): 11/02/2022  [facility] and [place] Number:  Herbalist and Address:  [facility], [place] [address], [place], [place] [address]      Provider Number: [id number]  Attending Physician Name and Address:  Lind Covert, MD  Relative Name and Phone Number:  Patient    Current Level of Care: Hospital Recommended Level of Care: [place] Prior Approval Number:    Date Approved/Denied:   PASRR Number: [id number]  Discharge Plan: SNF    Current Diagnoses: Patient Active Problem List   Diagnosis Date Noted   Primary hypertension 11/04/2022   Hyperlipidemia 11/03/2022   Right-sided partial anterior cerebral circulation infarction [facility]) 11/03/2022   Acute CVA (cerebrovascular accident) ([place]) 11/02/2022   Abnormal ECG 11/02/2022    Orientation RESPIRATION BLADDER Height & Weight     Self, Time, Situation, Place  Normal Continent, External catheter Weight: 198 lb 6.6 oz (90 kg) Height:  '5\' 3"'$  (160 cm)  BEHAVIORAL SYMPTOMS/MOOD NEUROLOGICAL BOWEL NUTRITION STATUS      Continent Diet (see d/c summary)  AMBULATORY STATUS COMMUNICATION OF NEEDS Skin   Extensive Assist Verbally Normal                       Personal Care Assistance Level of Assistance  Bathing, Feeding, Dressing Bathing Assistance: Maximum assistance Feeding assistance: Limited assistance Dressing Assistance: Maximum assistance     Functional Limitations Info  Sight, Hearing, Speech Sight Info: Adequate Hearing Info: Adequate Speech Info: Adequate    SPECIAL CARE FACTORS FREQUENCY  PT (By licensed PT), OT (By licensed OT)     PT Frequency: 5x/wk OT Frequency: 5x/wk            Contractures Contractures Info: Not present    Additional Factors Info  Code Status Code Status Info:  Full             Current Medications (11/06/2022):  This is the current hospital active medication list Current Facility-Administered Medications  Medication Dose Route Frequency Provider Last Rate Last Admin   acetaminophen (TYLENOL) tablet 650 mg  650 mg Oral Q6H PRN Darci Current, DO   650 mg at 11/04/22 1941   Or   acetaminophen (TYLENOL) suppository 650 mg  650 mg Rectal Q6H PRN Darci Current, DO       amLODipine (NORVASC) tablet 10 mg  10 mg Oral Daily Gerrit Heck, MD   10 mg at 11/06/22 0801   aspirin EC tablet 81 mg  81 mg Oral Daily Lovey Newcomer C, NP   81 mg at 11/06/22 0801   atorvastatin (LIPITOR) tablet 80 mg  80 mg Oral Daily Arlyce Dice, MD   80 mg at 11/06/22 0800   clopidogrel (PLAVIX) tablet 75 mg  75 mg Oral Daily Sandford Craze, RPH   75 mg at 11/06/22 0801   enoxaparin (LOVENOX) injection 40 mg  40 mg Subcutaneous Q24H Darci Current, DO   40 mg at 11/06/22 0801   [START ON 11/07/2022] losartan (COZAAR) tablet 100 mg  100 mg Oral Daily Arlyce Dice, MD       ondansetron [facility]) tablet 4 mg  4 mg Oral Q6H PRN Darci Current, DO       Or   ondansetron [facility]) injection 4 mg  4 mg Intravenous Q6H PRN Darci Current, DO  polyethylene glycol (MIRALAX / GLYCOLAX) packet 17 g  17 g Oral Daily PRN Darci Current, DO   17 g at 11/05/22 1527     Discharge Medications: Please see discharge summary for a list of discharge medications.  Relevant Imaging Results:  Relevant Lab Results:   Additional Information    Jinger Neighbors, LCSW

## 2022-11-06 NOTE — TOC Initial Note (Addendum)
Transition of Care [facility]) - Initial/Assessment Note    Patient Details  Name: Luis Roth MRN: [id number] Date of Birth: [birth year]  Transition of Care [facility]) CM/SW Contact:    Jinger Neighbors, LCSW Phone Number: 11/06/2022, 10:39 AM  Clinical Narrative:                  CSW met with pt at bedside to complete assessment. Pt is alert and oriented times four and engaged well. Pt is adaptable to SNF recommendations for STR. Pt comes from home, living with his cousins. CSW will complete work up and fax out. TOC following  Work up and fax out complete  Expected Discharge Plan: [place] Barriers to Discharge: SNF Pending bed offer   Patient Goals and CMS Choice   CMS Medicare.gov Compare Post Acute Care list provided to:: Patient Choice offered to / list presented to : Patient      Expected Discharge Plan and Services       Living arrangements for the past 2 months: Single Family Home                                      Prior Living Arrangements/Services Living arrangements for the past 2 months: Single Family Home Lives with:: Relatives Patient language and need for interpreter reviewed:: Yes Do you feel safe going back to the place where you live?: Yes      Need for Family Participation in Patient Care: Yes (Comment) Care giver support system in place?: Yes (comment)   Criminal Activity/Legal Involvement Pertinent to Current Situation/Hospitalization: No - Comment as needed  Activities of Daily Living Home Assistive Devices/Equipment: None ADL Screening (condition at time of admission) Patient's cognitive ability adequate to safely complete daily activities?: Yes Is the patient deaf or have difficulty hearing?: No Does the patient have difficulty seeing, even when wearing glasses/contacts?: No Does the patient have difficulty concentrating, remembering, or making decisions?: No Patient able to express need for assistance with ADLs?: Yes Does the  patient have difficulty dressing or bathing?: Yes Independently performs ADLs?: No Communication: Independent Dressing (OT): Needs assistance Is this a change from baseline?: Change from baseline, expected to last <3days Grooming: Needs assistance Is this a change from baseline?: Change from baseline, expected to last <3 days Feeding: Needs assistance Is this a change from baseline?: Change from baseline, expected to last <3 days Toileting: Needs assistance Is this a change from baseline?: Change from baseline, expected to last <3 days Walks in Home: Needs assistance Is this a change from baseline?: Change from baseline, expected to last <3 days Does the patient have difficulty walking or climbing stairs?: Yes Weakness of Legs: Left Weakness of Arms/Hands: Left  Permission Sought/Granted                  Emotional Assessment Appearance:: Appears stated age Attitude/Demeanor/Rapport: Engaged Affect (typically observed): Adaptable Orientation: : Oriented to Self, Oriented to Place, Oriented to  Time, Oriented to Situation Alcohol / Substance Use: Not Applicable Psych Involvement: No (comment)  Admission diagnosis:  Stroke [facility]) [I63.9] EKG abnormality [R94.31] Hypertensive urgency [I16.0] Acute CVA (cerebrovascular accident) [facility]) [I63.9] Patient Active Problem List   Diagnosis Date Noted   Primary hypertension 11/04/2022   Hyperlipidemia 11/03/2022   Right-sided partial anterior cerebral circulation infarction ([place]) 11/03/2022   Stroke ([place]) 11/02/2022   Abnormal ECG 11/02/2022   PCP:  Patient, [name]  Pharmacy:   [facility] [place] [address] [place] [address] Phone: [phone] Fax: [phone]     Social Determinants of Health (SDOH) Social History: [place]: No Food Insecurity (11/02/2022)  Housing: Low Risk  (11/02/2022)  Transportation Needs: No Transportation  Needs (11/02/2022)  Utilities: Not At Risk (11/02/2022)  Tobacco Use: Low Risk  (11/02/2022)   SDOH Interventions:     Readmission Risk Interventions     No data to display

## 2022-11-06 NOTE — Progress Notes (Addendum)
SLP Cancellation Note  Patient Details Name: Rani Sisney MRN: [id number] DOB: [birth year]   Cancelled treatment:       Reason Eval/Treat Not Completed: Other (comment). Pt screened by SLP on 11/03/22. Swallowing fully evaluated and found to be WNL. SLP wrote "Brief cognitive-linguistic screen was completed.  Pt reported that his rate of speech was reduced, but stated that otherwise speech, language, and cognitive abilities were at baseline. Pt's friends endorsed this.  Would recommend a comprehensive cognitive-linguistic evaluation in the setting of acute CVA." Pt with plan to d/c to SNF for rehab. Defer full eval to SLP at that setting.    Renatta Shrieves, Katherene Ponto 11/06/2022, 9:28 AM

## 2022-11-06 NOTE — Transfer of Care (Signed)
Immediate Anesthesia Transfer of Care Note  Patient: Luis Roth  Procedure(s) Performed: TRANSESOPHAGEAL ECHOCARDIOGRAM (TEE) BUBBLE STUDY  Patient Location: PACU and Endoscopy Unit  Anesthesia Type:MAC  Level of Consciousness: drowsy and patient cooperative  Airway & Oxygen Therapy: Patient Spontanous Breathing  Post-op Assessment: Report given to RN and Post -op Vital signs reviewed and stable  Post vital signs: Reviewed and stable  Last Vitals:  Vitals Value Taken Time  BP 129/87   Temp    Pulse 86 11/06/22 1401  Resp 15 11/06/22 1401  SpO2 100 % 11/06/22 1401  Vitals shown include unvalidated device data.  Last Pain:  Vitals:   11/06/22 1113  TempSrc: Temporal  PainSc: 0-No pain         Complications: No notable events documented.

## 2022-11-06 NOTE — Interval H&P Note (Signed)
History and Physical Interval Note:  11/06/2022 12:30 PM  Luis Roth  has presented today for surgery, with the diagnosis of STROKE.  The various methods of treatment have been discussed with the patient and family. After consideration of risks, benefits and other options for treatment, the patient has consented to  Procedure(s): TRANSESOPHAGEAL ECHOCARDIOGRAM (TEE) (N/A) as a surgical intervention.  The patient's history has been reviewed, patient examined, no change in status, stable for surgery.  I have reviewed the patient's chart and labs.  Questions were answered to the patient's satisfaction.     [name]

## 2022-11-06 NOTE — Anesthesia Preprocedure Evaluation (Addendum)
Anesthesia Evaluation  Patient identified by MRN, date of birth, ID band Patient awake    Reviewed: Allergy & Precautions, NPO status , Patient's Chart, lab work & pertinent test results  History of Anesthesia Complications Negative for: history of anesthetic complications  Airway Mallampati: II  TM Distance: >3 FB Neck ROM: Full    Dental no notable dental hx. (+) Dental Advisory Given   Pulmonary neg pulmonary ROS   Pulmonary exam normal        Cardiovascular negative cardio ROS Normal cardiovascular exam     Neuro/Psych CVA    GI/Hepatic negative GI ROS, Neg liver ROS,,,  Endo/Other  negative endocrine ROS    Renal/GU negative Renal ROS     Musculoskeletal negative musculoskeletal ROS (+)    Abdominal   Peds  Hematology negative hematology ROS (+)   Anesthesia Other Findings   Reproductive/Obstetrics                             Anesthesia Physical Anesthesia Plan  ASA: 3  Anesthesia Plan: MAC   Post-op Pain Management: Minimal or no pain anticipated   Induction: Intravenous  PONV Risk Score and Plan: 2 and Propofol infusion and Treatment may vary due to age or medical condition  Airway Management Planned: Mask  Additional Equipment:   Intra-op Plan:   Post-operative Plan:   Informed Consent: I have reviewed the patients History and Physical, chart, labs and discussed the procedure including the risks, benefits and alternatives for the proposed anesthesia with the patient or authorized representative who has indicated his/her understanding and acceptance.     Dental advisory given  Plan Discussed with: Anesthesiologist and CRNA  Anesthesia Plan Comments:        Anesthesia Quick Evaluation

## 2022-11-07 ENCOUNTER — Encounter (HOSPITAL_COMMUNITY): Payer: Self-pay | Admitting: Cardiology

## 2022-11-07 DIAGNOSIS — I160 Hypertensive urgency: Secondary | ICD-10-CM | POA: Diagnosis present

## 2022-11-07 DIAGNOSIS — I639 Cerebral infarction, unspecified: Secondary | ICD-10-CM | POA: Diagnosis not present

## 2022-11-07 LAB — PROTEIN C, TOTAL: Protein C, Total: 121 % (ref 60–150)

## 2022-11-07 NOTE — Progress Notes (Signed)
Mobility Specialist: Progress Note   11/07/22 1135  Mobility  Activity Transferred to/from [facility]  Level of Assistance Moderate assist, patient does 50-74%  Assistive Device Stedy  Activity Response Tolerated well  Mobility Referral Yes  $Mobility charge 1 Mobility   Pt received in the chair and requesting to use BSC. ModA to stand with verbal cues for hand placement and upright posture. Unable to have BM and assisted back to bed with help from NT. Pt has call bell and phone in reach. Bed alarm is on.   Bement Orenthal Debski Mobility Specialist Please contact via SecureChat or Rehab office at [phone]

## 2022-11-07 NOTE — Plan of Care (Signed)
  Problem: Education: Goal: Knowledge of General Education information will improve Description: Including pain rating scale, medication(s)/side effects and non-pharmacologic comfort measures Outcome: Progressing   Problem: Health Behavior/Discharge Planning: Goal: Ability to manage health-related needs will improve Outcome: Progressing   Problem: Clinical Measurements: Goal: Respiratory complications will improve Outcome: Progressing   Problem: Nutrition: Goal: Adequate nutrition will be maintained Outcome: Progressing   Problem: Coping: Goal: Level of anxiety will decrease Outcome: Progressing   Problem: Pain Managment: Goal: General experience of comfort will improve Outcome: Progressing

## 2022-11-07 NOTE — Progress Notes (Signed)
Physical Therapy Treatment Patient Details Name: Luis Roth MRN: [id number] DOB: 20-Nov-1975 Today's Date: 11/07/2022   History of Present Illness Pt is a 47 y/o M presenting to ED on 1/12 with BLE weakness, L >R. MRI revealing R ACA territory CVA. 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 PMH includes HTN.    PT Comments    Pt greeted supine in bed and agreeable to session with PTA/OT to continue to progress functional mobility. Pt less impulsive this session, however pt continues to be limited by L hemi-weakness, poor motor planning, decreased initiation of mobility, and poor balance/postural reactions. Pt continues to require up to max a +2 to complete bed mobility and mod a of 2 to come to stand in stedy frame. With extending time in front of mirror and max multimodal cues pt able to maintain upright standing in stedy with down to min a +2 for ~10 seconds at a time. Pt continues to demonstrate poor awareness and insight into deficits and current plan remains appropriate to address deficits and maximize functional independence and decrease caregiver burden. Pt continues to benefit from skilled PT services to progress toward functional mobility goals.     Recommendations for follow up therapy are one component of a multi-disciplinary discharge planning process, led by the attending physician.  Recommendations may be updated based on patient status, additional functional criteria and insurance authorization.  Follow Up Recommendations  Skilled nursing-short term rehab (<3 hours/day)     Assistance Recommended at Discharge Frequent or constant Supervision/Assistance  Patient can return home with the following A lot of help with walking and/or transfers;A lot of help with bathing/dressing/bathroom;Assistance with cooking/housework;Assist for transportation;Help with stairs or ramp for entrance   Equipment Recommendations   Other (comment) (TBD)    Recommendations for Other Services       Precautions / Restrictions Precautions Precautions: Fall Precaution Comments: L hemi Restrictions Weight Bearing Restrictions: No     Mobility  Bed Mobility Overal bed mobility: Needs Assistance Bed Mobility: Rolling, Sidelying to Sit   Sidelying to sit: Max assist, +2 for physical assistance       General bed mobility comments: with asssit pt able to hook RLE under LLE to assist however pt unable to initiate LEs to EOB withouts signifigant asssit, hand over hand to bring RUE to bedrail and pt with minimal effort to push with RUE into sitting despite max cues. assist with bed bad to scoot to EOB    Transfers Overall transfer level: Needs assistance Equipment used: Ambulation equipment used Transfers: Sit to/from Stand, Bed to chair/wheelchair/BSC Sit to Stand: Mod assist, +2 physical assistance, +2 safety/equipment           General transfer comment: mod of 2 to come into standing with steady, good use of R side however pt needing max asssit inititally to tuck hips engage glutes and elevate chest as pt with tendency for posterior lean and difficuly engaging RUE to "hold" with time and max multimodal cueing at standin with stedy in front of mirror pt able to maintain standing with intermittend min-mod a +2 Transfer via Lift Equipment: Stedy  Ambulation/Gait                   Stairs             Wheelchair Mobility    Modified Rankin (Stroke Patients Only) Modified Rankin (Stroke Patients Only) Pre-Morbid Rankin Score: No symptoms Modified Rankin: Severe disability  Balance Overall balance assessment: Needs assistance Sitting-balance support: Feet supported, Single extremity supported Sitting balance-Leahy Scale: Poor Sitting balance - Comments: posterior and L lateral lean Postural control: Left lateral lean, Posterior lean Standing balance support: During functional  activity Standing balance-Leahy Scale: Poor Standing balance comment: reliant on external support                            Cognition Arousal/Alertness: Awake/alert Behavior During Therapy: Impulsive Overall Cognitive Status: Impaired/Different from baseline Area of Impairment: Attention, Memory, Awareness, Safety/judgement, Following commands, Problem solving                   Current Attention Level: Focused Memory: Decreased short-term memory Following Commands: Follows one step commands inconsistently Safety/Judgement: Decreased awareness of safety, Decreased awareness of deficits Awareness: Emergent Problem Solving: Slow processing, Difficulty sequencing, Requires verbal cues, Requires tactile cues General Comments: pt less impulsive this session, noted motor planning deficits throughout        Exercises      General Comments General comments (skin integrity, edema, etc.): VSS on RA, pt expressing gratitude for session at end of session      Pertinent Vitals/Pain Pain Assessment Pain Assessment: No/denies pain    Home Living                          Prior Function            PT Goals (current goals can now be found in the care plan section) Acute Rehab PT Goals PT Goal Formulation: With patient Time For Goal Achievement: 11/17/22 Progress towards PT goals: Progressing toward goals    Frequency    Min 3X/week      PT Plan Discharge plan needs to be updated;Frequency needs to be updated    Co-evaluation PT/OT/SLP Co-Evaluation/Treatment: Yes Reason for Co-Treatment: Complexity of the patient's impairments (multi-system involvement);For patient/therapist safety;To address functional/ADL transfers          AM-PAC PT "6 Clicks" Mobility   Outcome Measure  Help needed turning from your back to your side while in a flat bed without using bedrails?: Total Help needed moving from lying on your back to sitting on the side of  a flat bed without using bedrails?: Total Help needed moving to and from a bed to a chair (including a wheelchair)?: Total Help needed standing up from a chair using your arms (e.g., wheelchair or bedside chair)?: Total Help needed to walk in hospital room?: Total Help needed climbing 3-5 steps with a railing? : Total 6 Click Score: 6    End of Session Equipment Utilized During Treatment: Gait belt Activity Tolerance: Patient tolerated treatment well Patient left: in chair;with call bell/phone within reach;with chair alarm set;with nursing/sitter in room Nurse Communication: Mobility status PT Visit Diagnosis: Unsteadiness on feet (R26.81);Hemiplegia and hemiparesis;Other abnormalities of gait and mobility (R26.89) Hemiplegia - Right/Left: Left Hemiplegia - dominant/non-dominant: Non-dominant Hemiplegia - caused by: Cerebral infarction     Time: 2841-3244 PT Time Calculation (min) (ACUTE ONLY): 26 min  Charges:  $Therapeutic Activity: 8-22 mins                     Meshell Abdulaziz R. PTA Acute Rehabilitation Services Office: [place] 11/07/2022, 11:00 AM

## 2022-11-07 NOTE — Progress Notes (Signed)
Occupational Therapy Treatment Patient Details Name: Luis Roth MRN: [id number] DOB: April 07, 1976 Today's Date: 11/07/2022   History of present illness Pt is a 47 y/o M presenting to ED on 1/12 with BLE weakness, L >R. MRI revealing R ACA territory CVA. 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 PMH includes HTN.   OT comments  Patient continues to make steady progress towards goals in skilled OT session. Patient's session encompassed  increasing ability to maintain midline with use of the mirror, ADLs in stedy at the sink, and increasing activity tolerance. Patient remains max A of 2 for bed mobility due to decreased ability to motor plan, with continued need for max tactile and verbal cues to facilitate midline with use of mirror at sink (mod of 2 for positioning). OT recommendation changed to SNF as patient does not have 24/7 support. OT will continue to follow.    Recommendations for follow up therapy are one component of a multi-disciplinary discharge planning process, led by the attending physician.  Recommendations may be updated based on patient status, additional functional criteria and insurance authorization.    Follow Up Recommendations  Skilled nursing-short term rehab (<3 hours/day)     Assistance Recommended at Discharge Frequent or constant Supervision/Assistance  Patient can return home with the following  A lot of help with walking and/or transfers;A lot of help with bathing/dressing/bathroom;Assistance with cooking/housework;Direct supervision/assist for medications management;Direct supervision/assist for financial management;Assist for transportation;Help with stairs or ramp for entrance   Equipment Recommendations  Other (comment) (defer to next venue)    Recommendations for Other Services      Precautions / Restrictions Precautions Precautions: Fall Precaution Comments: L  hemi Restrictions Weight Bearing Restrictions: No       Mobility Bed Mobility Overal bed mobility: Needs Assistance Bed Mobility: Rolling, Sidelying to Sit   Sidelying to sit: Max assist, +2 for physical assistance       General bed mobility comments: with asssit pt able to hook RLE under LLE to assist however pt unable to initiate LEs to EOB withouts signifigant asssit, hand over hand to bring RUE to bedrail and pt with minimal effort to push with RUE into sitting despite max cues. assist with bed bad to scoot to EOB    Transfers Overall transfer level: Needs assistance Equipment used: Ambulation equipment used Transfers: Sit to/from Stand, Bed to chair/wheelchair/BSC Sit to Stand: Mod assist, +2 physical assistance, +2 safety/equipment           General transfer comment: mod of 2 to come into standing with steady, good use of R side however pt needing max asssit inititally to tuck hips engage glutes and elevate chest as pt with tendency for posterior lean and difficuly engaging RUE to "hold" with time and max multimodal cueing at standin with stedy in front of mirror pt able to maintain standing with intermittend min-mod a +2 Transfer via Lift Equipment: Stedy   Balance Overall balance assessment: Needs assistance Sitting-balance support: Feet supported, Single extremity supported Sitting balance-Leahy Scale: Poor Sitting balance - Comments: posterior and L lateral lean Postural control: Left lateral lean, Posterior lean Standing balance support: During functional activity Standing balance-Leahy Scale: Poor Standing balance comment: reliant on external support                           ADL either performed or assessed with clinical judgement   ADL Overall ADL's : Needs assistance/impaired  Grooming: Minimal assistance;Wash/dry face;Oral care;Wash/dry hands Grooming Details (indicate cue type and reason): min A in stedy                              Functional mobility during ADLs: Minimal assistance;Moderate assistance;+2 for physical assistance;+2 for safety/equipment;Cueing for safety;Cueing for sequencing General ADL Comments: Session focus on increasing ability to maintain midline with use of the mirror, ADLs in stedy at the sink, and increasing activity tolerance.    Extremity/Trunk Assessment Upper Extremity Assessment LUE Deficits / Details: grossly 2/5, unable to shrug shoulders, increased movement/strength distally vs proximally LUE Coordination: decreased fine motor;decreased gross motor            Vision       Perception     Praxis      Cognition Arousal/Alertness: Awake/alert Behavior During Therapy: Impulsive Overall Cognitive Status: Impaired/Different from baseline Area of Impairment: Attention, Memory, Awareness, Safety/judgement, Following commands, Problem solving                   Current Attention Level: Focused Memory: Decreased short-term memory Following Commands: Follows one step commands inconsistently Safety/Judgement: Decreased awareness of safety, Decreased awareness of deficits Awareness: Emergent Problem Solving: Slow processing, Difficulty sequencing, Requires verbal cues, Requires tactile cues General Comments: pt less impulsive this session, noted motor planning deficits throughout        Exercises      Shoulder Instructions       General Comments VSS on RA    Pertinent Vitals/ Pain       Pain Assessment Pain Assessment: No/denies pain  Home Living                                          Prior Functioning/Environment              Frequency  Min 2X/week        Progress Toward Goals  OT Goals(current goals can now be found in the care plan section)  Progress towards OT goals: Progressing toward goals  Acute Rehab OT Goals Patient Stated Goal: to be less weak OT Goal Formulation: With patient Time For Goal Achievement:  11/17/22 Potential to Achieve Goals: [place] Discharge plan needs to be updated;Frequency remains appropriate    Co-evaluation      Reason for Co-Treatment: Complexity of the patient's impairments (multi-system involvement);Necessary to address cognition/behavior during functional activity;For patient/therapist safety;To address functional/ADL transfers   OT goals addressed during session: ADL's and self-care;Strengthening/ROM      AM-PAC OT "6 Clicks" Daily Activity     Outcome Measure   Help from another person eating meals?: None Help from another person taking care of personal grooming?: A Little Help from another person toileting, which includes using toliet, bedpan, or urinal?: Total Help from another person bathing (including washing, rinsing, drying)?: A Lot Help from another person to put on and taking off regular upper body clothing?: A Lot Help from another person to put on and taking off regular lower body clothing?: Total 6 Click Score: 13    End of Session Equipment Utilized During Treatment: Gait belt;Other (comment) Charlaine Dalton)  OT Visit Diagnosis: Unsteadiness on feet (R26.81);Other abnormalities of gait and mobility (R26.89);Muscle weakness (generalized) (M62.81);Other symptoms and signs involving cognitive function   Activity Tolerance Patient tolerated treatment well   Patient Left  in chair;with call bell/phone within reach;with chair alarm set   Nurse Communication Mobility status        Time: [id number] OT Time Calculation (min): 25 min  Charges: OT General Charges $OT Visit: 1 Visit OT Treatments $Self Care/Home Management : 8-22 mins  Corinne Ports E. Feliz Lincoln, OTR/L Acute Rehabilitation Services [phone]   Ascencion Dike 11/07/2022, 3:04 PM

## 2022-11-07 NOTE — Progress Notes (Addendum)
     Daily Progress Note Intern Pager: [phone]  Patient name: Luis Roth Medical record number: [id number] Date of birth: Oct 06, 1976 Age: 47 y.o. Gender: male  Primary Care Provider: Patient, [name] Consultants: Neurology (s/o) Code Status: Full   Pt Overview and Major Events to Date:  1/12: Admitted  1/16: TEE  Assessment and Plan:  Luis Roth is a 47yo M w/ hx of HTN that is admitted for subacute ischemic stroke of R ACA.   Patient is medically stable to discharge to SNF, pending placement.   * Acute CVA (cerebrovascular accident) ([place]) Appears to be improving, grip strength on the left hand is better today.  - Atorvastatin '80mg'$  daily - Plavix 75 mg for 3 weeks - ASA '81mg'$  daily - amlodipine 10 mg (started 1/14) - Losartan to 100 mg (started 1/16) - Neurology consulted, appreciate recommendations  - TEE w/ cardiology planned for today  - PT/OT - Fall precautions - Pending SNF, CIR no longer an option due to no care at home    FEN/GI: regular  PPx: Lovenox  Dispo:SNF today. Barriers include placement and insurance auth.   Subjective:  NAEO, resting comfortably. Medically stable for discharge to SNF  Objective: Temp:  [97.4 F (36.3 C)-98.5 F (36.9 C)] 97.8 F (36.6 C) (01/17 0924) Pulse Rate:  [59-85] 63 (01/17 0924) Resp:  [14-21] 18 (01/17 0924) BP: (118-155)/(80-98) 139/90 (01/17 0924) SpO2:  [93 %-100 %] 97 % (01/17 0924) Weight:  [90 kg] 90 kg (01/16 1113) Physical Exam: Well-appearing, no acute distress Cardio: Regular rate, regular rhythm, no murmurs on exam. Pulm: Clear, no wheezing, no crackles. No increased work of breathing Abdominal: bowel sounds present, soft, non-tender, non-distended Extremities: no peripheral edema  Neuro: alert and oriented x3, speech normal in content, no facial asymmetry  Laboratory: Most recent CBC Lab Results  Component Value Date   WBC 5.0 11/03/2022   HGB 13.0 11/03/2022   HCT 39.1 11/03/2022   MCV 90.1  11/03/2022   PLT 218 11/03/2022   Most recent BMP    Latest Ref Rng & Units 11/03/2022    2:19 AM  BMP  Glucose 70 - 99 mg/dL 107   BUN 6 - 20 mg/dL 9   Creatinine 0.61 - 1.24 mg/dL 1.13   Sodium 135 - 145 mmol/L 137   Potassium 3.5 - 5.1 mmol/L 3.5   Chloride 98 - 111 mmol/L 104   CO2 22 - 32 mmol/L 23   Calcium 8.9 - 10.3 mg/dL 9.1    Darci Current, DO 11/07/2022, 9:27 AM  PGY-1, [place] Intern pager: [phone], text pages welcome Secure chat group [place]

## 2022-11-07 NOTE — TOC Progression Note (Signed)
Transition of Care [facility]) - Progression Note    Patient Details  Name: Luis Roth MRN: [id number] Date of Birth: 12-11-1975  Transition of Care [facility]) CM/SW Contact  Jinger Neighbors, [place] Phone Number: 11/07/2022, 10:55 AM  Clinical Narrative:     CSW presented bed offers.   Expected Discharge Plan: Skilled Nursing Facility Barriers to Discharge: SNF Pending bed offer  Expected Discharge Plan and Services       Living arrangements for the past 2 months: Single Family Home                                       Social Determinants of Health (SDOH) Interventions SDOH Screenings   Food Insecurity: No Food Insecurity (11/02/2022)  Housing: Low Risk  (11/02/2022)  Transportation Needs: No Transportation Needs (11/02/2022)  Utilities: Not At Risk (11/02/2022)  Tobacco Use: Low Risk  (11/06/2022)    Readmission Risk Interventions     No data to display

## 2022-11-08 DIAGNOSIS — R4189 Other symptoms and signs involving cognitive functions and awareness: Secondary | ICD-10-CM | POA: Diagnosis present

## 2022-11-08 DIAGNOSIS — I639 Cerebral infarction, unspecified: Secondary | ICD-10-CM | POA: Diagnosis not present

## 2022-11-08 DIAGNOSIS — I517 Cardiomegaly: Secondary | ICD-10-CM | POA: Diagnosis present

## 2022-11-08 LAB — BETA-2-GLYCOPROTEIN I ABS, IGG/M/A
Beta-2 Glyco I IgG: <9 GPI IgG units (ref 0–20)
Beta-2-Glycoprotein I IgA: 13 GPI IgA units (ref 0–25)
Beta-2-Glycoprotein I IgM: <9 GPI IgM units (ref 0–32)

## 2022-11-08 LAB — TROPONIN I (HIGH SENSITIVITY)
Troponin I (High Sensitivity): 12 ng/L (ref <18)
Troponin I (High Sensitivity): 12 ng/L (ref <18)

## 2022-11-08 MED ORDER — SENNOSIDES-DOCUSATE SODIUM 8.6-50 MG PO TABS
1.0000 | ORAL_TABLET | Freq: Every day | ORAL | Status: DC
  Administered 2022-11-09 – 2022-11-11 (×3): 1 via ORAL
  Filled 2022-11-08 (×3): qty 1

## 2022-11-08 NOTE — Plan of Care (Signed)
  Problem: Education: Goal: Knowledge of General Education information will improve Description: Including pain rating scale, medication(s)/side effects and non-pharmacologic comfort measures Outcome: Progressing   Problem: Health Behavior/Discharge Planning: Goal: Ability to manage health-related needs will improve Outcome: Progressing   Problem: Clinical Measurements: Goal: Ability to maintain clinical measurements within normal limits will improve Outcome: Progressing Goal: Will remain free from infection Outcome: Progressing Goal: Diagnostic test results will improve Outcome: Progressing Goal: Respiratory complications will improve Outcome: Progressing Goal: Cardiovascular complication will be avoided Outcome: Progressing   Problem: Activity: Goal: Risk for activity intolerance will decrease Outcome: Progressing   Problem: Nutrition: Goal: Adequate nutrition will be maintained Outcome: Progressing   Problem: Coping: Goal: Level of anxiety will decrease Outcome: Progressing   Problem: Elimination: Goal: Will not experience complications related to bowel motility Outcome: Progressing Goal: Will not experience complications related to urinary retention Outcome: Progressing   Problem: Pain Managment: Goal: General experience of comfort will improve Outcome: Progressing   Problem: Safety: Goal: Ability to remain free from injury will improve Outcome: Progressing   Problem: Skin Integrity: Goal: Risk for impaired skin integrity will decrease Outcome: Progressing   Problem: Education: Goal: Knowledge of disease or condition will improve Outcome: Progressing Goal: Knowledge of secondary prevention will improve (MUST DOCUMENT ALL) Outcome: Progressing Goal: Knowledge of patient specific risk factors will improve [place] N/A or DELETE if not current risk factor) Outcome: Progressing   Problem: Ischemic Stroke/TIA Tissue Perfusion: Goal: Complications of ischemic  stroke/TIA will be minimized Outcome: Progressing   Problem: Coping: Goal: Will verbalize positive feelings about self Outcome: Progressing Goal: Will identify appropriate support needs Outcome: Progressing   Problem: Health Behavior/Discharge Planning: Goal: Ability to manage health-related needs will improve Outcome: Progressing Goal: Goals will be collaboratively established with patient/family Outcome: Progressing   Problem: Self-Care: Goal: Ability to participate in self-care as condition permits will improve Outcome: Progressing Goal: Verbalization of feelings and concerns over difficulty with self-care will improve Outcome: Progressing Goal: Ability to communicate needs accurately will improve Outcome: Progressing   Problem: Nutrition: Goal: Risk of aspiration will decrease Outcome: Progressing Goal: Dietary intake will improve Outcome: Progressing

## 2022-11-08 NOTE — TOC Progression Note (Signed)
Transition of Care [facility]) - Progression Note    Patient Details  Name: Luis Roth MRN: [id number] Date of Birth: 11-09-1975  [facility] CM/SW Contact  Jinger Neighbors, [place] Phone Number: 11/08/2022, 10:17 AM  Clinical Narrative:     CSW met with pt to follow up on SNF offers. Pt chooses [facility] and reports his friends will transport him there. CSW explained safety risk and encouraged him to take a non-emergency ambulance, but pt insisted on friends transporting. CSW alerted Juliann Pulse at [facility] to make her aware of choice. CSW messaged [employer] for auth request.   Expected Discharge Plan: [place] Barriers to Discharge: SNF Pending bed offer  Expected Discharge Plan and Services       Living arrangements for the past 2 months: Single Family Home                                       Social Determinants of Health (SDOH) Interventions SDOH Screenings   Food Insecurity: No Food Insecurity (11/02/2022)  Housing: Low Risk  (11/02/2022)  Transportation Needs: No Transportation Needs (11/02/2022)  Utilities: Not At Risk (11/02/2022)  Tobacco Use: Low Risk  (11/07/2022)    Readmission Risk Interventions     No data to display

## 2022-11-08 NOTE — Progress Notes (Signed)
     Daily Progress Note Intern Pager: [phone]  Patient name: Luis Roth Medical record number: [id number] Date of birth: [birth year] Age: 47 y.o. Gender: male  Primary Care Provider: Patient, [name] Consultants: Neurology (s/o)  Code Status: Full code   Pt Overview and Major Events to Date:  1/12: Admitted  1/16: s/p TEE  Assessment and Plan:  Luis Roth is a 47yo M w/ hx of HTN that is admitted for subacute ischemic stroke of R ACA.    Patient is medically stable to discharge to SNF, pending placement.   * Acute CVA (cerebrovascular accident) ([place]) Appears to be improving, grip strength on the left hand is better today.  - Atorvastatin '80mg'$  daily - Plavix 75 mg for 3 weeks - ASA '81mg'$  daily - amlodipine 10 mg (started 1/14) - Losartan to 100 mg (started 1/16) - Neurology consulted, appreciate recommendations  - TEE w/ cardiology planned for today  - PT/OT - Fall precautions - Pending SNF, CIR no longer an option due to no care at home    FEN/GI: regular PPx: Lovenox  Dispo:SNF today. Barriers include insurance authorization.   Subjective:  Well appearing, no acute events overnight.   Objective: Temp:  [97.4 F (36.3 C)-98.5 F (36.9 C)] 98.5 F (36.9 C) (01/18 0817) Pulse Rate:  [60-74] 60 (01/18 0817) Resp:  [17-18] 18 (01/18 0817) BP: (125-159)/(81-92) 159/91 (01/18 0817) SpO2:  [97 %-100 %] 99 % (01/18 0817) Physical Exam: Well-appearing, no acute distress Cardio: Regular rate, regular rhythm, no murmurs on exam. Pulm: Clear, no wheezing, no crackles. No increased work of breathing Abdominal: bowel sounds present, soft, non-tender, non-distended Extremities: no peripheral edema  Neuro: alert and oriented x3, speech normal in content, no facial asymmetry, strength intact on the right, severely decreased on the left - unchanged from previous exams   Laboratory: Most recent CBC Lab Results  Component Value Date   WBC 5.0 11/03/2022   HGB 13.0 11/03/2022    HCT 39.1 11/03/2022   MCV 90.1 11/03/2022   PLT 218 11/03/2022   Most recent BMP    Latest Ref Rng & Units 11/03/2022    2:19 AM  BMP  Glucose 70 - 99 mg/dL 107   BUN 6 - 20 mg/dL 9   Creatinine 0.61 - 1.24 mg/dL 1.13   Sodium 135 - 145 mmol/L 137   Potassium 3.5 - 5.1 mmol/L 3.5   Chloride 98 - 111 mmol/L 104   CO2 22 - 32 mmol/L 23   Calcium 8.9 - 10.3 mg/dL 9.1    Darci Current, DO 11/08/2022, 8:31 AM  PGY-1, [place] Intern pager: [phone], text pages welcome Secure chat group [place]

## 2022-11-08 NOTE — Progress Notes (Signed)
Mobility Specialist: Progress Note   11/08/22 1306  Mobility  Activity Transferred to/from [facility]  Level of Assistance Moderate assist, patient does 50-74%  Assistive Device Stedy  Activity Response Tolerated well  Mobility Referral Yes  $Mobility charge 1 Mobility   Pt received in the bed and agreeable to mobility. ModA with bed mobility as well as to stand. Transferred to the chair and practiced STS x5 requiring minA by last rep. Pt requesting to use BSC once finished, BM unsuccessful. Assisted pt back to bed after session per request with call bell and phone at his side. Bed alarm is on.   [place] Mobility Specialist Please contact via SecureChat or Rehab office at [phone]

## 2022-11-09 ENCOUNTER — Other Ambulatory Visit: Payer: Self-pay | Admitting: Student

## 2022-11-09 DIAGNOSIS — I4891 Unspecified atrial fibrillation: Secondary | ICD-10-CM

## 2022-11-09 DIAGNOSIS — I639 Cerebral infarction, unspecified: Secondary | ICD-10-CM

## 2022-11-09 DIAGNOSIS — R9431 Abnormal electrocardiogram [ECG] [EKG]: Secondary | ICD-10-CM

## 2022-11-09 MED ORDER — POLYETHYLENE GLYCOL 3350 17 G PO PACK
17.0000 g | PACK | Freq: Three times a day (TID) | ORAL | Status: DC | PRN
  Administered 2022-11-09 – 2022-11-11 (×2): 17 g via ORAL
  Filled 2022-11-09: qty 1

## 2022-11-09 NOTE — Progress Notes (Signed)
     Daily Progress Note Intern Pager: [phone]  Patient name: Luis Roth Medical record number: [id number] Date of birth: 21-Dec-1975 Age: 47 y.o. Gender: male  Primary Care Provider: Patient, [name] Consultants: Neurology (s/o) Code Status: Full code   Pt Overview and Major Events to Date:  1/12: Admitted  1/16: s/p TEE 1/18: Medically stable, awaiting SNF placement   Assessment and Plan:  Luis Roth is a 47yo M w/ hx of HTN that is admitted for subacute ischemic stroke of R ACA.    Patient is medically stable to discharge to SNF, pending placement.   Repeated EKG yesterday to monitor for improvement from admission. EKG continues to be abnormal with ST-depressions and elevations. Patient remained asymptomatic. Obtained troponin x2 to complete the work-up, they remained low and flat. Will need to follow up with cardiology outpatient and will arrange 30 day monitoring at dispo.    * Acute CVA (cerebrovascular accident) ([place]) Stable.  - Atorvastatin '80mg'$  daily - Plavix 75 mg for 3 weeks - ASA '81mg'$  daily - amlodipine 10 mg (started 1/14) - Losartan to 100 mg (started 1/16) - PT/OT - Fall precautions   FEN/GI: regular  PPx: Lovenox  Dispo:SNF today. Barriers include SNF bed placement.   Subjective:  NAEO, no new complaints.   Objective: Temp:  [97.5 F (36.4 C)-98.5 F (36.9 C)] 97.5 F (36.4 C) (01/19 0807) Pulse Rate:  [57-69] 62 (01/19 0807) Resp:  [14-18] 18 (01/19 0807) BP: (116-137)/(67-98) 134/80 (01/19 0807) SpO2:  [96 %-100 %] 96 % (01/19 0807) Physical Exam: Well-appearing, no acute distress Cardio: Regular rate, regular rhythm, no murmurs on exam. Pulm: Clear, no wheezing, no crackles. No increased work of breathing Abdominal: bowel sounds present, soft Extremities: no peripheral edema  Neuro: alert and oriented x3, speech normal in content, no facial asymmetry, strength severely decreased on the left   Laboratory: Most recent CBC Lab Results   Component Value Date   WBC 5.0 11/03/2022   HGB 13.0 11/03/2022   HCT 39.1 11/03/2022   MCV 90.1 11/03/2022   PLT 218 11/03/2022   Most recent BMP    Latest Ref Rng & Units 11/03/2022    2:19 AM  BMP  Glucose 70 - 99 mg/dL 107   BUN 6 - 20 mg/dL 9   Creatinine 0.61 - 1.24 mg/dL 1.13   Sodium 135 - 145 mmol/L 137   Potassium 3.5 - 5.1 mmol/L 3.5   Chloride 98 - 111 mmol/L 104   CO2 22 - 32 mmol/L 23   Calcium 8.9 - 10.3 mg/dL 9.1    Darci Current, DO 11/09/2022, 9:33 AM  PGY-1, [place] Intern pager: [phone], text pages welcome Secure chat group [place]

## 2022-11-09 NOTE — Progress Notes (Signed)
Physical Therapy Treatment Patient Details Name: Luis Roth MRN: [id number] DOB: 06-08-1976 Today's Date: 11/09/2022   History of Present Illness Pt is a 47 y/o M presenting to ED on 1/12 with BLE weakness, L >R. MRI revealing R ACA territory CVA. 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 PMH includes HTN.    PT Comments    Pt is progressing towards goals. Decreased need for assistance with sit to stand this session. Pt is still unable to ambulate due to inability to coordinate the R and L lower extremity movement. Pt continues with difficulty following directions and sequencing movement in order to decrease need for physical assistance with functional activities. Due to pt current functional status, home set up and available assistance at home recommending SNF on discharge from acute care hospital setting in order to decrease risk for immobility, falls, re-hospitalization and to return to PLOF. Pt may progress to AIR level care. No signs/symptoms of cardiac/respiratory distress throughout session.    Recommendations for follow up therapy are one component of a multi-disciplinary discharge planning process, led by the attending physician.  Recommendations may be updated based on patient status, additional functional criteria and insurance authorization.  Follow Up Recommendations  Skilled nursing-short term rehab (<3 hours/day) Can patient physically be transported by private vehicle: No   Assistance Recommended at Discharge Frequent or constant Supervision/Assistance  Patient can return home with the following A lot of help with walking and/or transfers;Assistance with cooking/housework;Assist for transportation;Help with stairs or ramp for entrance   Equipment Recommendations  Other (comment) (defer to post acute)    Recommendations for Other Services Rehab consult     Precautions / Restrictions  Precautions Precautions: Fall Precaution Comments: L hemi though has some strength in the L side Restrictions Weight Bearing Restrictions: No     Mobility  Bed Mobility Overal bed mobility: Needs Assistance Bed Mobility: Rolling, Supine to Sit Rolling: Supervision   Supine to sit: Min assist     General bed mobility comments: Pt requires Min A at the LLE for going supine to sitting with verbal cues for sequencing. Patient Response: Flat affect  Transfers Overall transfer level: Needs assistance Equipment used: Ambulation equipment used, Rolling walker (2 wheels) Transfers: Sit to/from Stand, Bed to chair/wheelchair/BSC Sit to Stand: Min assist, +2 safety/equipment   Step pivot transfers: Mod assist, +2 safety/equipment, Max assist       General transfer comment: Min of 2 for standing in stedy. Tried standing with RW pt was Min of two for management of AD and to push through the RLE in order to prevent pt from moving it anteriorly and kicking it out during stand for 2x. At end of session pt was Max A for squat pivot from [facility] to recliner with 1 person assistance without AD. Heavy lean posterior despite multiple verbal cues to lean forward. Legs of recliner opened up to keep pt on chair. Pt was then able to assist scooting up in fully reclined recliner and was then situated. Transfer via Lift Equipment: Stedy  Ambulation/Gait Ambulation/Gait assistance: Mod assist, +2 safety/equipment, +2 physical assistance Gait Distance (Feet): 1 Feet Assistive device: Rolling walker (2 wheels) Gait Pattern/deviations: Decreased stance time - right, Wide base of support       General Gait Details: Pt took steps with the RLE unable to clear LLE from the floor when stepping from recliner to [facility] using RW with heavy cueing and assistance with navigating AD.  Modified Rankin (Stroke Patients Only) Modified Rankin (Stroke Patients Only) Pre-Morbid Rankin Score: No symptoms Modified Rankin:  Severe disability     Balance Overall balance assessment: Needs assistance Sitting-balance support: Feet supported, Single extremity supported Sitting balance-Leahy Scale: Fair Sitting balance - Comments: Initially fair posture. By end of session after last session pt was not following directions to lean forward to prevent sliding off recliner. Postural control: Left lateral lean Standing balance support: During functional activity Standing balance-Leahy Scale: Poor Standing balance comment: Pt able to correct when cued for short periods of time.        Cognition Arousal/Alertness: Awake/alert Behavior During Therapy: Impulsive Overall Cognitive Status: Impaired/Different from baseline       Following Commands: Follows one step commands inconsistently Safety/Judgement: Decreased awareness of safety, Decreased awareness of deficits     General Comments: pt less impulsive this session, noted motor planning deficits throughout           General Comments General comments (skin integrity, edema, etc.): Pt seems to be improving on awareness of deficit but continues to be impulsive and to have difficulty following directions; multiple times when asked to bend knee for standing would place foot on techs foot or this physical therapists foot.      Pertinent Vitals/Pain Pain Assessment Pain Assessment: No/denies pain     PT Goals (current goals can now be found in the care plan section) Acute Rehab PT Goals Patient Stated Goal: back to PLOF PT Goal Formulation: With patient Time For Goal Achievement: 11/17/22 Potential to Achieve Goals: Good Progress towards PT goals: Progressing toward goals    Frequency    Min 3X/week      PT Plan Current plan remains appropriate       AM-PAC PT "6 Clicks" Mobility   Outcome Measure  Help needed turning from your back to your side while in a flat bed without using bedrails?: A Little Help needed moving from lying on your back  to sitting on the side of a flat bed without using bedrails?: A Little Help needed moving to and from a bed to a chair (including a wheelchair)?: A Lot Help needed standing up from a chair using your arms (e.g., wheelchair or bedside chair)?: A Lot Help needed to walk in hospital room?: Total Help needed climbing 3-5 steps with a railing? : Total 6 Click Score: 12    End of Session Equipment Utilized During Treatment: Gait belt Activity Tolerance: Patient tolerated treatment well Patient left: in chair;with call bell/phone within reach;with chair alarm set Nurse Communication: Mobility status PT Visit Diagnosis: Unsteadiness on feet (R26.81);Hemiplegia and hemiparesis;Other abnormalities of gait and mobility (R26.89) Hemiplegia - Right/Left: Left Hemiplegia - dominant/non-dominant: Non-dominant Hemiplegia - caused by: Cerebral infarction     Time: 2820-6015 PT Time Calculation (min) (ACUTE ONLY): 53 min  Charges:  $Therapeutic Activity: 53-67 mins                     Tomma Rakers, DPT, CLT  Acute Rehabilitation Services Office: [phone] (Secure chat preferred)    Ander Purpura 11/09/2022, 12:07 PM

## 2022-11-09 NOTE — Plan of Care (Signed)
  Problem: Education: Goal: Knowledge of General Education information will improve Description: Including pain rating scale, medication(s)/side effects and non-pharmacologic comfort measures Outcome: Progressing   Problem: Health Behavior/Discharge Planning: Goal: Ability to manage health-related needs will improve Outcome: Progressing   Problem: Clinical Measurements: Goal: Ability to maintain clinical measurements within normal limits will improve Outcome: Progressing Goal: Will remain free from infection Outcome: Progressing Goal: Diagnostic test results will improve Outcome: Progressing Goal: Respiratory complications will improve Outcome: Progressing Goal: Cardiovascular complication will be avoided Outcome: Progressing   Problem: Activity: Goal: Risk for activity intolerance will decrease Outcome: Progressing   Problem: Nutrition: Goal: Adequate nutrition will be maintained Outcome: Progressing   Problem: Coping: Goal: Level of anxiety will decrease Outcome: Progressing   Problem: Elimination: Goal: Will not experience complications related to bowel motility Outcome: Progressing Goal: Will not experience complications related to urinary retention Outcome: Progressing   Problem: Pain Managment: Goal: General experience of comfort will improve Outcome: Progressing   Problem: Safety: Goal: Ability to remain free from injury will improve Outcome: Progressing   Problem: Skin Integrity: Goal: Risk for impaired skin integrity will decrease Outcome: Progressing   Problem: Education: Goal: Knowledge of disease or condition will improve Outcome: Progressing Goal: Knowledge of secondary prevention will improve (MUST DOCUMENT ALL) Outcome: Progressing Goal: Knowledge of patient specific risk factors will improve [place] N/A or DELETE if not current risk factor) Outcome: Progressing   Problem: Ischemic Stroke/TIA Tissue Perfusion: Goal: Complications of ischemic  stroke/TIA will be minimized Outcome: Progressing   Problem: Coping: Goal: Will verbalize positive feelings about self Outcome: Progressing Goal: Will identify appropriate support needs Outcome: Progressing   Problem: Health Behavior/Discharge Planning: Goal: Ability to manage health-related needs will improve Outcome: Progressing Goal: Goals will be collaboratively established with patient/family Outcome: Progressing   Problem: Self-Care: Goal: Ability to participate in self-care as condition permits will improve Outcome: Progressing Goal: Verbalization of feelings and concerns over difficulty with self-care will improve Outcome: Progressing Goal: Ability to communicate needs accurately will improve Outcome: Progressing   Problem: Nutrition: Goal: Risk of aspiration will decrease Outcome: Progressing Goal: Dietary intake will improve Outcome: Progressing

## 2022-11-09 NOTE — Progress Notes (Signed)
Ordered 30 day event monitor for further evaluation of stroke at the request of Neurology. Dr. Harriet Masson to read. Patient has never been seen by our office so New Patient visit also arranged to review monitor results.  Darreld Mclean, PA-C 11/09/2022 3:45 PM

## 2022-11-09 NOTE — Progress Notes (Signed)
Mobility Specialist: Progress Note   11/09/22 1426  Mobility  Activity Transferred from chair to bed  Level of Assistance Moderate assist, patient does 50-74%  Assistive Device Stedy  Activity Response Tolerated well  Mobility Referral Yes  $Mobility charge 1 Mobility   Pt assisted back to bed per request. ModA to stand from the chair with cues for hand placement. Required physical assist for RUE placement. Pt back in bed with call bell and phone in reach. Bed alarm is on.   [place] Mobility Specialist Please contact via SecureChat or Rehab office at [phone]

## 2022-11-10 DIAGNOSIS — I639 Cerebral infarction, unspecified: Secondary | ICD-10-CM | POA: Diagnosis not present

## 2022-11-10 NOTE — Progress Notes (Signed)
FMTS Interim Progress Note  Received a page from RN detailing fall event.  Fall was unwitnessed but patient found on the floor.  He describes a low impact event of slipping out of his recliner, denies hitting his head, denies LOC.  He denies any pain or trauma from the event.  He denies trying to get up from his chair.  Continue fall precautions and notify MD/DO for future events.  Darci Current, DO 11/10/2022, 3:45 PM PGY-1, [place] Medicine Service pager [phone]

## 2022-11-10 NOTE — Progress Notes (Signed)
Patient was found on the floor out from the recliner; he said, "he was reaching for the commode chair" and his food table was in front of him; "he pushed it out and attempted to get himself up". Fall was unwitnessed; MD notified; vital signs taken; no visible injury; patient denies hitting his head; denies pain.

## 2022-11-10 NOTE — Progress Notes (Signed)
     Daily Progress Note Intern Pager: [phone]  Patient name: Luis Roth Medical record number: [id number] Date of birth: 05-23-1976 Age: 47 y.o. Gender: male  Primary Care Provider: Patient, [name] Consultants: Neurology (s/o) Code Status: Full Code  Pt Overview and Major Events to Date:  1/12: Admitted  1/16: s/p TEE 1/18: Medically stable, awaiting SNF placement   Assessment and Plan:  [name] is a 47yo M w/ hx of HTN that is admitted for subacute ischemic stroke of R ACA.    Patient is medically stable to discharge to SNF, pending placement. Patient will need to follow up with cardiology outpatient, will arrange 30 day monitoring at dispo.    * Acute CVA (cerebrovascular accident) ([place]) Stable, awaiting SNF.  - Atorvastatin '80mg'$  daily - Plavix 75 mg for 3 weeks - ASA '81mg'$  daily - amlodipine 10 mg (started 1/14) - Losartan to 100 mg (started 1/16) - PT/OT - Fall precautions   FEN/GI: Regular diet PPx: Lovenox Dispo:SNF today. Barriers include Bed placement.   Subjective:  NAEON, no complaint's this morning  Objective: Temp:  [97.5 F (36.4 C)-98.7 F (37.1 C)] 98.4 F (36.9 C) (01/20 0500) Pulse Rate:  [62-70] 64 (01/20 0500) Resp:  [16-18] 16 (01/20 0500) BP: (118-143)/(75-81) 118/80 (01/20 0500) SpO2:  [96 %-99 %] 99 % (01/20 0500) Physical Exam: General: NAD, resting comfortably in bed Cardiovascular: RRR, [facility] Respiratory: CTABL, no wheezing or rhonci Abdomen: Soft, NTTP, non-distended Extremities: No pitting edema Neuro: Sensation intact grossly, cranial nerves II-XII intact, motor strength 5/5 on right side, 2/5 on left side  Laboratory: Most recent CBC Lab Results  Component Value Date   WBC 5.0 11/03/2022   HGB 13.0 11/03/2022   HCT 39.1 11/03/2022   MCV 90.1 11/03/2022   PLT 218 11/03/2022   Most recent BMP    Latest Ref Rng & Units 11/03/2022    2:19 AM  BMP  Glucose 70 - 99 mg/dL 107   BUN 6 - 20 mg/dL 9   Creatinine 0.61 -  1.24 mg/dL 1.13   Sodium 135 - 145 mmol/L 137   Potassium 3.5 - 5.1 mmol/L 3.5   Chloride 98 - 111 mmol/L 104   CO2 22 - 32 mmol/L 23   Calcium 8.9 - 10.3 mg/dL 9.1      Holley Bouche, MD 11/10/2022, 7:18 AM  PGY-2, [place] Intern pager: [phone], text pages welcome Secure chat group [place]

## 2022-11-11 DIAGNOSIS — I639 Cerebral infarction, unspecified: Secondary | ICD-10-CM | POA: Diagnosis not present

## 2022-11-11 NOTE — Progress Notes (Signed)
     Daily Progress Note Intern Pager: [phone]  Patient name: Jaimon Bugaj Medical record number: [id number] Date of birth: 08-28-1976 Age: 47 y.o. Gender: male  Primary Care Provider: Patient, [name] Consultants: Neurology (s/o) Code Status: Full Code  Pt Overview and Major Events to Date:  1/12: Admitted  1/16: s/p TEE 1/18: Medically stable, awaiting SNF placement  Assessment and Plan:  [name] is a 47yo M w/ hx of HTN that is admitted for subacute ischemic stroke of R ACA.    Patient is medically stable to discharge to SNF, pending placement. Patient will need to follow up with cardiology outpatient, will arrange 30 day monitoring at dispo.    Patient had unwitnessed fall yesterday from seat to floor, did not hit head or injure himself.   * Acute CVA (cerebrovascular accident) ([place]) Stable, awaiting SNF.  - Atorvastatin '80mg'$  daily - Plavix 75 mg for 3 weeks - ASA '81mg'$  daily - amlodipine 10 mg (started 1/14) - Losartan to 100 mg (started 1/16) - PT/OT - Fall precautions    FEN/GI: Regular Diet PPx: Lovenox Dispo:SNF today. Barriers include bed placement.   Subjective:  NAEON, doing well this morning  Objective: Temp:  [98.2 F (36.8 C)-99.1 F (37.3 C)] 98.8 F (37.1 C) (01/20 2336) Pulse Rate:  [61-81] 61 (01/20 2336) Resp:  [16-20] 20 (01/20 2336) BP: (118-131)/(67-96) 121/68 (01/20 2336) SpO2:  [96 %-100 %] 100 % (01/20 2336) Physical Exam: General: NAD, laying in bed comfortable Cardiovascular: RRR, [place] Respiratory: CTABL Abdomen: Soft, NTTP, non-distended Extremities: No edema Neuro: CN II-XII intact, sensation intact throughout, strength 5/5 on right UE and LE, 2/5 in LUE 2/5 in RLE  Laboratory: Most recent CBC Lab Results  Component Value Date   WBC 5.0 11/03/2022   HGB 13.0 11/03/2022   HCT 39.1 11/03/2022   MCV 90.1 11/03/2022   PLT 218 11/03/2022   Most recent BMP    Latest Ref Rng & Units 11/03/2022    2:19 AM  BMP  Glucose 70  - 99 mg/dL 107   BUN 6 - 20 mg/dL 9   Creatinine 0.61 - 1.24 mg/dL 1.13   Sodium 135 - 145 mmol/L 137   Potassium 3.5 - 5.1 mmol/L 3.5   Chloride 98 - 111 mmol/L 104   CO2 22 - 32 mmol/L 23   Calcium 8.9 - 10.3 mg/dL 9.1      Holley Bouche, MD 11/11/2022, 1:20 AM  PGY-2, [place] Intern pager: [phone], text pages welcome Secure chat group [place]

## 2022-11-12 LAB — FACTOR 5 LEIDEN

## 2022-11-12 LAB — PROTHROMBIN GENE MUTATION

## 2022-11-12 MED ORDER — LOSARTAN POTASSIUM 100 MG PO TABS: 100.0000 mg | ORAL_TABLET | Freq: Every day | ORAL | Status: DC

## 2022-11-12 MED ORDER — ATORVASTATIN CALCIUM 80 MG PO TABS: 80.0000 mg | ORAL_TABLET | Freq: Every day | ORAL | Status: DC

## 2022-11-12 MED ORDER — CLOPIDOGREL BISULFATE 75 MG PO TABS: 75.0000 mg | ORAL_TABLET | Freq: Every day | ORAL | 0 refills | Status: AC

## 2022-11-12 MED ORDER — AMLODIPINE BESYLATE 10 MG PO TABS: 10.0000 mg | ORAL_TABLET | Freq: Every day | ORAL | Status: DC

## 2022-11-12 MED ORDER — ASPIRIN 81 MG PO TBEC: 81.0000 mg | DELAYED_RELEASE_TABLET | Freq: Every day | ORAL | 12 refills | Status: DC

## 2022-11-12 NOTE — Progress Notes (Signed)
Report called to [place] at [facility]. All questions answered. PTAR called by LCSW. Bedside RN aware.

## 2022-11-12 NOTE — Progress Notes (Signed)
     Daily Progress Note Intern Pager: [phone]  Patient name: Luis Roth Medical record number: [id number] Date of birth: 06-Oct-1976 Age: 47 y.o. Gender: male  Primary Care Provider: Patient, [name] Consultants: Signed off: Neuro, cardiology Code Status: Full  Pt Overview and Major Events to Date:  1/12: Admitted  1/16: s/p TEE 1/18: Medically stable, awaiting SNF placement  Assessment and Plan:  Luis Roth is a 47yo M w/ hx of HTN that is admitted for subacute ischemic stroke of R ACA.    Patient is medically stable to discharge to SNF, pending placement. Patient will need to follow up with cardiology outpatient for 30-day monitoring.   * Acute CVA (cerebrovascular accident) ([place]) Stable, awaiting SNF.  - Atorvastatin '80mg'$  daily - Plavix 75 mg for 3 weeks - ASA '81mg'$  daily - amlodipine 10 mg (started 1/14) - Losartan to 100 mg (started 1/16) - PT/OT - Fall precautions     FEN/GI: Regular Diet PPx: Lovenox Dispo:SNF pending placement  Subjective:  NAEON, feeling well. Reports mild dry cough that developed overnight, otherwise not feeling sick. Reports that he got a call from his employer who terminated him due to being unable to return to work - discussed that he should touch base with them about FMLA   Objective: Temp:  [98 F (36.7 C)-99.3 F (37.4 C)] 98.7 F (37.1 C) (01/22 0959) Pulse Rate:  [61-79] 62 (01/22 0959) Resp:  [16-20] 18 (01/22 0959) BP: (120-130)/(82-91) 130/82 (01/22 0959) SpO2:  [98 %-100 %] 100 % (01/22 0959) Physical Exam: General: NAD Cardiovascular: RRR Respiratory: CTAB on RA Abdomen: soft NT/ND Extremities: no edema b/l Neuro: speech coherent, alert, left sided weakness  Laboratory: Most recent CBC Lab Results  Component Value Date   WBC 5.0 11/03/2022   HGB 13.0 11/03/2022   HCT 39.1 11/03/2022   MCV 90.1 11/03/2022   PLT 218 11/03/2022   Most recent BMP    Latest Ref Rng & Units 11/03/2022    2:19 AM  BMP  Glucose 70  - 99 mg/dL 107   BUN 6 - 20 mg/dL 9   Creatinine 0.61 - 1.24 mg/dL 1.13   Sodium 135 - 145 mmol/L 137   Potassium 3.5 - 5.1 mmol/L 3.5   Chloride 98 - 111 mmol/L 104   CO2 22 - 32 mmol/L 23   Calcium 8.9 - 10.3 mg/dL 9.1      August Albino, MD 11/12/2022, 10:47 AM  PGY-1, [place] Intern pager: [phone], text pages welcome Secure chat group [place]

## 2022-11-12 NOTE — TOC Transition Note (Signed)
Transition of Care [facility]) - CM/SW Discharge Note   Patient Details  Name: Luis Roth MRN: [id number] Date of Birth: 1976/05/26  Transition of Care [facility]) CM/SW Contact:  Geralynn Ochs, LCSW Phone Number: 11/12/2022, 3:17 PM   Clinical Narrative:   CSW confirmed patient can discharge [facility]. Patient initially requested family to provide transport, but cousin arrived and said the patient will need transportation arranged for him. Transport arranged with [facility].   CSW alerted by MD of patient's concern about possibly losing his job due to his medical condition. CSW discussed with the patient to reach out to his company's HR to ask about FMLA to save his position while he recovers. Patient indicated understanding.  Nurse to call report to [phone].    Final next level of care: Skilled Nursing Facility Barriers to Discharge: Barriers Resolved   Patient Goals and CMS Choice CMS Medicare.gov Compare Post Acute Care list provided to:: Patient Choice offered to / list presented to : Patient  Discharge Placement                Patient chooses bed at:  [facility]) Patient to be transferred to facility by: [place] Name of family member notified: Self Patient and family notified of of transfer: 11/12/22  Discharge Plan and Services Additional resources added to the After Visit Summary for                                       Social Determinants of Health (SDOH) Interventions SDOH Screenings   Food Insecurity: No Food Insecurity (11/02/2022)  Housing: Low Risk  (11/02/2022)  Transportation Needs: No Transportation Needs (11/02/2022)  Utilities: Not At Risk (11/02/2022)  Tobacco Use: Low Risk  (11/07/2022)     Readmission Risk Interventions     No data to display

## 2022-11-12 NOTE — TOC Progression Note (Signed)
Transition of Care [facility]) - Progression Note    Patient Details  Name: Luis Roth MRN: [id number] Date of Birth: 01-25-1976  Transition of Care [facility]) CM/SW [place], [place] Phone Number: 11/12/2022, 12:10 PM  Clinical Narrative:   CSW notified by previous CSW that patient's insurance is out of network with [employer], patient had chosen [facility] and authorization was received on Friday. Authorization [id number], approved through 1/24. CSW sent information to [facility] and confirmed they can admit this afternoon. CSW updated MD on plan to discharge to SNF today. CSW to follow.    Expected Discharge Plan: Skilled Nursing Facility Barriers to Discharge: SNF Pending bed offer  Expected Discharge Plan and Services       Living arrangements for the past 2 months: Single Family Home                                       Social Determinants of Health (SDOH) Interventions SDOH Screenings   Food Insecurity: No Food Insecurity (11/02/2022)  Housing: Low Risk  (11/02/2022)  Transportation Needs: No Transportation Needs (11/02/2022)  Utilities: Not At Risk (11/02/2022)  Tobacco Use: Low Risk  (11/07/2022)    Readmission Risk Interventions     No data to display

## 2022-11-12 NOTE — Progress Notes (Signed)
Physical Therapy Treatment Patient Details Name: Luis Roth MRN: [id number] DOB: 07/31/1976 Today's Date: 11/12/2022   History of Present Illness Pt is a 47 y/o M presenting to ED on 1/12 with BLE weakness, L >R. MRI revealing R ACA territory CVA. 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 PMH includes HTN.    PT Comments    Pt greeted up in chair on arrival with continued progress towards acute goals, however pt continues to be limited by L hemi-weakness, some impulsivity, decreased safety awareness, decreased awareness of deficits and poor balance/postural reactions. Pt able to transfer chair <> BSC with up to mod assist of 1 via squat and stand pivot. Pt with improved transfers STS this date, able to complete with RW and mod assist. Pt continues to require cues for weight shift to R as pt with tendency for L lateral lean and max cues for safety. Current plan remains appropriate to address deficits and maximize functional independence and decrease caregiver burden. Pt continues to benefit from skilled PT services to progress toward functional mobility goals.    Recommendations for follow up therapy are one component of a multi-disciplinary discharge planning process, led by the attending physician.  Recommendations may be updated based on patient status, additional functional criteria and insurance authorization.  Follow Up Recommendations  Skilled nursing-short term rehab (<3 hours/day) Can patient physically be transported by private vehicle: No   Assistance Recommended at Discharge Frequent or constant Supervision/Assistance  Patient can return home with the following A lot of help with walking and/or transfers;Assistance with cooking/housework;Assist for transportation;Help with stairs or ramp for entrance   Equipment Recommendations  Other (comment) (defer to post acute)    Recommendations for Other Services        Precautions / Restrictions Precautions Precautions: Fall Precaution Comments: L hemi though has some strength in the L side Restrictions Weight Bearing Restrictions: No     Mobility  Bed Mobility Overal bed mobility: Needs Assistance             General bed mobility comments: pt OOB pre and post session    Transfers Overall transfer level: Needs assistance Equipment used: Rolling walker (2 wheels), None Transfers: Sit to/from Stand, Bed to chair/wheelchair/BSC Sit to Stand: Mod assist Stand pivot transfers: Mod assist   Squat pivot transfers: Min assist     General transfer comment: min assist to squat pivot to R chair>BSC. mod a to stand pivot to L BSC>chair, pt needing max cues for safety and for hand placement, pt able to come to stand from recliner to RW with mod a, strong L lean needing cues for safety as pt lifting R hand from RW and picking RW up off ground    Ambulation/Gait                   Stairs             Wheelchair Mobility    Modified Rankin (Stroke Patients Only) Modified Rankin (Stroke Patients Only) Pre-Morbid Rankin Score: No symptoms Modified Rankin: Moderately severe disability     Balance Overall balance assessment: Needs assistance Sitting-balance support: Feet supported, Single extremity supported Sitting balance-Leahy Scale: Fair     Standing balance support: During functional activity Standing balance-Leahy Scale: Poor Standing balance comment: Pt able to correct when cued for short periods of time.  Cognition Arousal/Alertness: Awake/alert Behavior During Therapy: Impulsive Overall Cognitive Status: Impaired/Different from baseline Area of Impairment: Attention, Memory, Awareness, Safety/judgement, Following commands, Problem solving                   Current Attention Level: Focused Memory: Decreased short-term memory Following Commands: Follows one step  commands inconsistently Safety/Judgement: Decreased awareness of safety, Decreased awareness of deficits Awareness: Emergent Problem Solving: Slow processing, Difficulty sequencing, Requires verbal cues, Requires tactile cues General Comments: pt less impulsive this session, noted motor planning deficits throughout        Exercises      General Comments General comments (skin integrity, edema, etc.): Pt continues to be improving on awareness of deficit but continues to be impulsive and to have difficulty following directions and remaining on task      Pertinent Vitals/Pain Pain Assessment Pain Assessment: No/denies pain    Home Living                          Prior Function            PT Goals (current goals can now be found in the care plan section) Acute Rehab PT Goals Patient Stated Goal: back to PLOF PT Goal Formulation: With patient Time For Goal Achievement: 11/17/22 Progress towards PT goals: Progressing toward goals    Frequency    Min 3X/week      PT Plan Current plan remains appropriate    Co-evaluation              AM-PAC PT "6 Clicks" Mobility   Outcome Measure  Help needed turning from your back to your side while in a flat bed without using bedrails?: A Little Help needed moving from lying on your back to sitting on the side of a flat bed without using bedrails?: A Little Help needed moving to and from a bed to a chair (including a wheelchair)?: A Lot Help needed standing up from a chair using your arms (e.g., wheelchair or bedside chair)?: A Lot Help needed to walk in hospital room?: Total Help needed climbing 3-5 steps with a railing? : Total 6 Click Score: 12    End of Session Equipment Utilized During Treatment: Gait belt Activity Tolerance: Patient tolerated treatment well Patient left: in chair;with call bell/phone within reach;with chair alarm set Nurse Communication: Mobility status PT Visit Diagnosis: Unsteadiness on  feet (R26.81);Hemiplegia and hemiparesis;Other abnormalities of gait and mobility (R26.89) Hemiplegia - Right/Left: Left Hemiplegia - dominant/non-dominant: Non-dominant Hemiplegia - caused by: Cerebral infarction     Time: 1223-1242 PT Time Calculation (min) (ACUTE ONLY): 19 min  Charges:  $Therapeutic Activity: 8-22 mins                     Janesia Joswick R. PTA Acute Rehabilitation Services Office: [place] 11/12/2022, 1:27 PM

## 2022-11-12 NOTE — Discharge Summary (Signed)
[facility] Discharge Summary  Patient name: Luis Roth Medical record number: [id number] Date of birth: 1976-08-15 Age: 47 y.o. Gender: male Date of Admission: 11/02/2022  Date of Discharge: 11/12/22 Admitting Physician: Darci Current, DO  Primary Care Provider: Patient, [name] Consultants: Neurology, cardiology  Indication for Hospitalization: Acute CVA  Discharge Diagnoses/Problem List:  Principal Problem for Admission: Acute CVA Other Problems addressed during stay:  Principal Problem:   Acute CVA (cerebrovascular accident) [facility]) Active Problems:   Abnormal ECG   Hyperlipidemia   Right-sided partial anterior cerebral circulation infarction [facility])   Primary hypertension   Hypertensive urgency   Cognitive impairment   LVH, severe    Brief Hospital Course:  Luis Roth is a 47 y.o. male presenting with subacute ACA territory stroke. PMHx includes HTN. His hospital course is outlined below:   Subacute Stroke:  Patient presented to the emergency room s/p 2-3 days onset of acute left sided weakness. In the ED, code stroke was called. VS significant for hypertension to 202/102. STAT CT and MRI showed subacute stroke in the ACA territory. Follow up CTA head and neck were negative for stenosis. CBC and CMP WNL. Lipid panel showed LDL of 142, 80 mg of Atorvastatin was started. Neurology consulted and recommended 3 weeks of Plavix and ASA followed by just ASA. Neurology also ordered antithrombin III, Protein C, Protein S, lupus anticoagulant, cardiolipin antibodies, ANA which resulted within normal limits.  Beta-2-glycoprotein, and factor 5 leiden pending results. PT and OT evaluated the patient and recommended SNF placement due to poor support at home to continue rehab.   ECG Changes:  On admission his EKG showed ST segment elevation and depression, cardiology reviewed the EKG and said given no CP and stroke like symptoms it is more LHV. Troponin  remained flat. His echocardiogram showed left atrial dilation. Neurology recommended a follow up TEE which showed normal LVEF, severe LVH and no LA appendage clot. Neurology recommended a follow up 30 day cardiac monitor to evaluate for abnormal heart rhythm in contribution to this stroke. Seen by cardiology who noted they will facilitate this.   PCP Follow Up:  Check BMP outpatient in 1 week to monitor electrolytes since starting ARB inpatient.  Follow-up on beta-2 glycoprotein, factor V Leiden and prothrombin gene mutation labs. Follow-up 30-day cardiac monitoring.  CT Head and Neck WO Contrast:  IMPRESSION: There are no signs of bleeding within the cranium. There is ill-defined area of subtle decreased density in the anteromedial right frontal lobe. Possibility of ischemic change is not excluded. Follow-up MRI may be considered.  CT C-Spine WO Contrast:  IMPRESSION: Multilevel left worse than right uncovertebral ridging in the cervical spine as detailed above resulting in moderate to severe left neural foraminal stenosis at C3-C4, moderate bilateral neural foraminal stenosis at C4-C5, and severe left neural foraminal stenosis at C6-C7.  MRI Brain WO Contrast:  IMPRESSION: Patchy acute to early subacute infarcts throughout the right ACA distribution without hemorrhage or mass effect.  MRI C-Spine WO Contrast:  IMPRESSION: 1. Motion degraded study. 2. Multilevel neural foraminal stenosis as above, moderate on the left at C3-C4, moderate to severe on the left and moderate on the right at C4-C5, and severe on the left at C6-C7. No greater than mild spinal canal stenosis.  CTA Head and Neck: IMPRESSION: 1. 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 2. Moderate stenoses in the distal right P2 segment and superior P3  branches bilaterally. 3. Normal CTA of the neck.  Disposition: SNF  Discharge  Condition: stable   Discharge Exam:  Vitals:   11/12/22 0959 11/12/22 1122  BP: 130/82 (!) 128/90  Pulse: 62 80  Resp: 18 20  Temp: 98.7 F (37.1 C)   SpO2: 100% 100%   General: NAD Cardiovascular: RRR Respiratory: CTAB on RA Abdomen: soft NT/ND Extremities: no edema b/l Neuro: speech coherent, alert, left sided weakness  Significant Procedures: TEE  Significant Labs and Imaging:  No results for input(s): "WBC", "HGB", "HCT", "PLT" in the last 48 hours. No results for input(s): "NA", "K", "CL", "CO2", "GLUCOSE", "BUN", "CREATININE", "CALCIUM", "MG", "PHOS", "ALKPHOS", "AST", "ALT", "ALBUMIN", "PROTEIN" in the last 48 hours.   Results/Tests Pending at Time of Discharge: n/a  Discharge Medications:  Allergies as of 11/12/2022       Reactions   Quinine Derivatives Itching        Medication List     STOP taking these medications    ibuprofen 600 MG tablet Commonly known as: ADVIL   oxyCODONE-acetaminophen 5-325 MG tablet Commonly known as: PERCOCET/ROXICET   ZINC PO       TAKE these medications    amLODipine 10 MG tablet Commonly known as: NORVASC Take 1 tablet (10 mg total) by mouth daily.   aspirin EC 81 MG tablet Take 1 tablet (81 mg total) by mouth daily. Swallow whole.   atorvastatin 80 MG tablet Commonly known as: LIPITOR Take 1 tablet (80 mg total) by mouth daily.   clopidogrel 75 MG tablet Commonly known as: PLAVIX Take 1 tablet (75 mg total) by mouth daily for 15 days.   losartan 100 MG tablet Commonly known as: COZAAR Take 1 tablet (100 mg total) by mouth daily.        Discharge Instructions: Please refer to Patient Instructions section of EMR for full details.  Patient was counseled important signs and symptoms that should prompt return to medical care, changes in medications, dietary instructions, activity restrictions, and follow up appointments.   Follow-Up Appointments:   August Albino, MD 11/12/2022, 12:55 PM PGY-1, [place]

## 2022-12-12 ENCOUNTER — Ambulatory Visit (INDEPENDENT_AMBULATORY_CARE_PROVIDER_SITE_OTHER): Payer: 59 | Admitting: Nurse Practitioner

## 2022-12-12 ENCOUNTER — Encounter: Payer: Self-pay | Admitting: Nurse Practitioner

## 2022-12-12 VITALS — BP 117/80 | HR 71 | Temp 98.0°F | Ht 60.0 in | Wt 182.4 lb

## 2022-12-12 DIAGNOSIS — E785 Hyperlipidemia, unspecified: Secondary | ICD-10-CM

## 2022-12-12 DIAGNOSIS — Z23 Encounter for immunization: Secondary | ICD-10-CM

## 2022-12-12 DIAGNOSIS — R4189 Other symptoms and signs involving cognitive functions and awareness: Secondary | ICD-10-CM

## 2022-12-12 DIAGNOSIS — I517 Cardiomegaly: Secondary | ICD-10-CM

## 2022-12-12 DIAGNOSIS — I10 Essential (primary) hypertension: Secondary | ICD-10-CM | POA: Diagnosis not present

## 2022-12-12 DIAGNOSIS — Z1211 Encounter for screening for malignant neoplasm of colon: Secondary | ICD-10-CM

## 2022-12-12 DIAGNOSIS — I639 Cerebral infarction, unspecified: Secondary | ICD-10-CM | POA: Diagnosis not present

## 2022-12-12 MED ORDER — SERTRALINE HCL 25 MG PO TABS: 25.0000 mg | ORAL_TABLET | Freq: Every day | ORAL | 0 refills | Status: DC

## 2022-12-12 MED ORDER — AMLODIPINE BESYLATE 10 MG PO TABS: 10.0000 mg | ORAL_TABLET | Freq: Every day | ORAL | Status: DC

## 2022-12-12 MED ORDER — LOSARTAN POTASSIUM 100 MG PO TABS: 100.0000 mg | ORAL_TABLET | Freq: Every day | ORAL | Status: DC

## 2022-12-12 MED ORDER — ATORVASTATIN CALCIUM 80 MG PO TABS: 80.0000 mg | ORAL_TABLET | Freq: Every day | ORAL | Status: DC

## 2022-12-12 NOTE — Assessment & Plan Note (Signed)
Has upcoming appointment with cardiology, he currently denies shortness of breath, chest pain syncope

## 2022-12-12 NOTE — Progress Notes (Signed)
New Patient Office Visit  Subjective:  Patient ID: Luis Roth, male    DOB: [birth year]  Age: 47 y.o. MRN: [id number]  CC:  Chief Complaint  Patient presents with   Establish Care    HPI Luis Roth is a 47 y.o. male with past medical history of hypertension, CVA, hyperlipidemia presents to establish care and for hospital discharge follow-up.  No previous PCP.  Patient was on admission at the hospital between 11/02/2022 to 11/12/2022 for subacute stroke.  He was discharged to rehab for 30 days after his hospital stay.  Currently doing physical therapy daily at home using a front wheel walker for ambulation still has some weakness on his left upper extremity and left lower extremity.  Currently on aspirin 81 mg daily, Plavix 75 mg daily, atorvastatin 80 mg daily, amlodipine 10 mg daily, losartan 100 mg daily.  Patient reports that he is feeling better and his strength has improved.  Has upcoming appointment with cardiology.  Referral to neurologist for follow-up placed today.  He was encouraged to keep this upcoming appointment.  Cologuard ordered to screen for colon cancer flu vaccine administered in the office today.     History reviewed. No pertinent past medical history.  Past Surgical History:  Procedure Laterality Date   ANKLE FRACTURE SURGERY Right    BUBBLE STUDY  11/06/2022   Procedure: BUBBLE STUDY;  Surgeon: Berniece Salines, DO;  Location: [facility];  Service: Cardiovascular;;   ORIF ANKLE FRACTURE Right 2015   TEE WITHOUT CARDIOVERSION N/A 11/06/2022   Procedure: TRANSESOPHAGEAL ECHOCARDIOGRAM (TEE);  Surgeon: Berniece Salines, DO;  Location: [facility];  Service: Cardiovascular;  Laterality: N/A;    Family History  Problem Relation Age of Onset   Kidney disease Father    Stroke Brother    Heart attack Neg Hx     Social History   Socioeconomic History   Marital status: Married    Spouse name: Not on file   Number of children: 3   Years of education: Not on file    Highest education level: Not on file  Occupational History   Not on file  Tobacco Use   Smoking status: Never   Smokeless tobacco: Never  Substance and Sexual Activity   Alcohol use: Not Currently    Comment: occasionally   Drug use: Never   Sexual activity: Not on file  Other Topics Concern   Not on file  Social History Narrative   Lives with his cousin   Social Determinants of Health   Financial Resource Strain: Not on file  Food Insecurity: No Food Insecurity (11/02/2022)   Hunger Vital Sign    Worried About Running Out of Food in the Last Year: Never true    Ran Out of Food in the Last Year: Never true  Transportation Needs: No Transportation Needs (11/02/2022)   PRAPARE - Hydrologist (Medical): No    Lack of Transportation (Non-Medical): No  Physical Activity: Not on file  Stress: Not on file  Social Connections: Not on file  Intimate Partner Violence: Not At Risk (11/02/2022)   Humiliation, Afraid, Rape, and Kick questionnaire    Fear of Current or Ex-Partner: No    Emotionally Abused: No    Physically Abused: No    Sexually Abused: No    ROS Review of Systems  Constitutional: Negative.  Negative for appetite change, chills, diaphoresis, fatigue, fever and unexpected weight change.  Respiratory: Negative.  Negative for apnea, cough, chest tightness,  shortness of breath, wheezing and stridor.   Cardiovascular: Negative.  Negative for chest pain, palpitations and leg swelling.  Gastrointestinal:  Negative for abdominal distention, abdominal pain and anal bleeding.  Musculoskeletal:  Positive for gait problem. Negative for arthralgias, back pain, joint swelling and myalgias.  Neurological:  Positive for weakness. Negative for dizziness, seizures, facial asymmetry, speech difficulty, light-headedness, numbness and headaches.  Psychiatric/Behavioral:  Negative for agitation, behavioral problems, confusion, decreased concentration, dysphoric  mood, self-injury and suicidal ideas. The patient is not nervous/anxious.     Objective:   Today's Vitals: BP 117/80   Pulse 71   Temp 98 F (36.7 C)   Ht 5' (1.524 m)   Wt 182 lb 6.4 oz (82.7 kg)   SpO2 100%   BMI 35.62 kg/m   Physical Exam Constitutional:      General: He is not in acute distress.    Appearance: Normal appearance. He is not ill-appearing, toxic-appearing or diaphoretic.  Eyes:     General: No scleral icterus.       Right eye: No discharge.        Left eye: No discharge.     Extraocular Movements: Extraocular movements intact.  Cardiovascular:     Rate and Rhythm: Normal rate and regular rhythm.     Pulses: Normal pulses.     Heart sounds: Normal heart sounds. No murmur heard.    No friction rub. No gallop.  Pulmonary:     Effort: Pulmonary effort is normal. No respiratory distress.     Breath sounds: Normal breath sounds. No stridor. No wheezing, rhonchi or rales.  Chest:     Chest wall: No tenderness.  Abdominal:     General: There is no distension.     Palpations: Abdomen is soft.     Tenderness: There is no abdominal tenderness. There is no guarding.  Musculoskeletal:        General: No tenderness.     Right lower leg: No edema.     Left lower leg: No edema.     Comments: Using a walker for ambulation  Skin:    General: Skin is warm and dry.     Capillary Refill: Capillary refill takes less than 2 seconds.     Coloration: Skin is not jaundiced or pale.  Neurological:     Mental Status: He is alert and oriented to person, place, and time.     Sensory: No sensory deficit.     Motor: Weakness present.     Gait: Gait abnormal.     Comments: Power 5/5 right upper extremity, right lower extremity 4/5 left upper extremity, left lower extremity,  Psychiatric:        Mood and Affect: Mood normal.        Behavior: Behavior normal.        Thought Content: Thought content normal.        Judgment: Judgment normal.     Assessment & Plan:    Problem List Items Addressed This Visit       Cardiovascular and Mediastinum   Acute CVA (cerebrovascular accident) ([place]) - Primary    Continue Plavix 75 mg for 3 weeks, aspirin 81 mg daily, amlodipine 10 mg daily, atorvastatin 80 mg daily, losartan 100 mg daily Continue PT OT      Relevant Medications   amLODipine (NORVASC) 10 MG tablet   atorvastatin (LIPITOR) 80 MG tablet   losartan (COZAAR) 100 MG tablet   Other Relevant Orders   Ambulatory referral to  Neurology   Primary hypertension    BP Readings from Last 3 Encounters:  12/12/22 117/80  11/12/22 (!) 141/92  01/27/20 (!) 183/111  Currently well-controlled on amlodipine 10 mg daily, losartan 100 mg daily Continue current medications DASH diet advised Checking BMP today Continue physical therapy      Relevant Medications   amLODipine (NORVASC) 10 MG tablet   atorvastatin (LIPITOR) 80 MG tablet   losartan (COZAAR) 100 MG tablet   Other Relevant Orders   Basic Metabolic Panel   LVH, severe    Has upcoming appointment with cardiology, he currently denies shortness of breath, chest pain syncope      Relevant Medications   amLODipine (NORVASC) 10 MG tablet   atorvastatin (LIPITOR) 80 MG tablet   losartan (COZAAR) 100 MG tablet     Other   Hyperlipidemia    LDL goal is less than 55 Continue atorvastatin 80 mg daily Check lipid panel at next follow-up      Relevant Medications   amLODipine (NORVASC) 10 MG tablet   atorvastatin (LIPITOR) 80 MG tablet   losartan (COZAAR) 100 MG tablet   Cognitive impairment    Now resolved patient is alert and oriented x 4 He is on Zoloft 25 mg daily at bedtime patient denies depression anxiety, will reevaluate need for this medication at his upcoming appointment      Relevant Medications   sertraline (ZOLOFT) 25 MG tablet   Need for influenza vaccination    Patient educated on CDC recommendation for the influenza vaccine. Verbal consent was obtained from the patient,  vaccine administered by nurse, no sign of adverse reactions noted at this time. Patient education on arm soreness and use of tylenol or  for this patient  was discussed. Patient educated on the signs and symptoms of adverse effect and advise to contact the office if they occur.       Relevant Orders   Flu Vaccine QUAD 67moIM (Fluarix, Fluzone & Alfiuria Quad PF) (Completed)   Other Visit Diagnoses     Screening for colon cancer       Relevant Orders   Cologuard       Outpatient Encounter Medications as of 12/12/2022  Medication Sig   aspirin EC 81 MG tablet Take 1 tablet (81 mg total) by mouth daily. Swallow whole.   clopidogrel (PLAVIX) 75 MG tablet Take 75 mg by mouth at bedtime.   [DISCONTINUED] amLODipine (NORVASC) 10 MG tablet Take 1 tablet (10 mg total) by mouth daily.   [DISCONTINUED] atorvastatin (LIPITOR) 80 MG tablet Take 1 tablet (80 mg total) by mouth daily.   [DISCONTINUED] losartan (COZAAR) 100 MG tablet Take 1 tablet (100 mg total) by mouth daily.   [DISCONTINUED] sertraline (ZOLOFT) 25 MG tablet Take 25 mg by mouth at bedtime.   amLODipine (NORVASC) 10 MG tablet Take 1 tablet (10 mg total) by mouth daily.   atorvastatin (LIPITOR) 80 MG tablet Take 1 tablet (80 mg total) by mouth daily.   losartan (COZAAR) 100 MG tablet Take 1 tablet (100 mg total) by mouth daily.   sertraline (ZOLOFT) 25 MG tablet Take 1 tablet (25 mg total) by mouth at bedtime.   No facility-administered encounter medications on file as of 12/12/2022.    Follow-up: Return in about 2 months (around 02/10/2023) for HTN/HLD.   FRenee Rival FNP

## 2022-12-12 NOTE — Assessment & Plan Note (Signed)
Continue Plavix 75 mg for 3 weeks, aspirin 81 mg daily, amlodipine 10 mg daily, atorvastatin 80 mg daily, losartan 100 mg daily Continue PT OT

## 2022-12-12 NOTE — Assessment & Plan Note (Signed)
BP Readings from Last 3 Encounters:  12/12/22 117/80  11/12/22 (!) 141/92  01/27/20 (!) 183/111  Currently well-controlled on amlodipine 10 mg daily, losartan 100 mg daily Continue current medications DASH diet advised Checking BMP today Continue physical therapy

## 2022-12-12 NOTE — Assessment & Plan Note (Signed)
LDL goal is less than 55 Continue atorvastatin 80 mg daily Check lipid panel at next follow-up

## 2022-12-12 NOTE — Assessment & Plan Note (Signed)
Patient educated on CDC recommendation for the influenza vaccine. Verbal consent was obtained from the patient, vaccine administered by nurse, no sign of adverse reactions noted at this time. Patient education on arm soreness and use of tylenol or  for this patient  was discussed. Patient educated on the signs and symptoms of adverse effect and advise to contact the office if they occur.

## 2022-12-12 NOTE — Assessment & Plan Note (Addendum)
Now resolved patient is alert and oriented x 4 He is on Zoloft 25 mg daily at bedtime patient denies depression anxiety, will reevaluate need for this medication at his upcoming appointment

## 2022-12-12 NOTE — Patient Instructions (Addendum)
Acute CVA (cerebrovascular accident) [facility])  - Ambulatory referral to Neurology  . Screening for colon cancer - Cologuard    It is important that you exercise regularly at least 30 minutes 5 times a week as tolerated  Think about what you will eat, plan ahead. Choose " clean, green, fresh or frozen" over canned, processed or packaged foods which are more sugary, salty and fatty. 70 to 75% of food eaten should be vegetables and fruit. Three meals at set times with snacks allowed between meals, but they must be fruit or vegetables. Aim to eat over a 12 hour period , example 7 am to 7 pm, and STOP after  your last meal of the day. Drink water,generally about 64 ounces per day, no other drink is as healthy. Fruit juice is best enjoyed in a healthy way, by EATING the fruit.  Thanks for choosing [facility] we consider it a privelige to serve you.

## 2022-12-13 LAB — BASIC METABOLIC PANEL
BUN/Creatinine Ratio: 10 (ref 9–20)
BUN: 11 mg/dL (ref 6–24)
CO2: 23 mmol/L (ref 20–29)
Calcium: 10.1 mg/dL (ref 8.7–10.2)
Chloride: 101 mmol/L (ref 96–106)
Creatinine, Ser: 1.11 mg/dL (ref 0.76–1.27)
Glucose: 100 mg/dL — ABNORMAL HIGH (ref 70–99)
Potassium: 4.1 mmol/L (ref 3.5–5.2)
Sodium: 139 mmol/L (ref 134–144)
eGFR: 82 mL/min/{1.73_m2} (ref >59)

## 2022-12-13 NOTE — Progress Notes (Signed)
Stable labs continue current medications

## 2022-12-24 ENCOUNTER — Encounter: Payer: Self-pay | Admitting: *Deleted

## 2022-12-25 NOTE — Progress Notes (Signed)
Lab results mail to pt home address.

## 2023-01-04 ENCOUNTER — Other Ambulatory Visit: Payer: Self-pay

## 2023-01-04 MED ORDER — CLOPIDOGREL BISULFATE 75 MG PO TABS: 75.0000 mg | ORAL_TABLET | Freq: Every day | ORAL | 0 refills | Status: DC

## 2023-01-07 ENCOUNTER — Other Ambulatory Visit: Payer: Self-pay | Admitting: Nurse Practitioner

## 2023-01-07 ENCOUNTER — Telehealth: Payer: Self-pay | Admitting: Nurse Practitioner

## 2023-01-07 ENCOUNTER — Ambulatory Visit: Payer: 59 | Attending: Cardiology | Admitting: Cardiology

## 2023-01-07 DIAGNOSIS — E785 Hyperlipidemia, unspecified: Secondary | ICD-10-CM

## 2023-01-07 MED ORDER — ATORVASTATIN CALCIUM 80 MG PO TABS: 80.0000 mg | ORAL_TABLET | Freq: Every day | ORAL | 2 refills | Status: DC

## 2023-01-07 NOTE — Telephone Encounter (Signed)
Caller & Relationship to patient: patient - walked in to clinic to request refill - both bottles were empty  MRN #  [id number]   Call Back Number:   Date of Last Office Visit: 01/04/2023     Date of Next Office Visit: 02/11/2023    Medication(s) to be Refilled: Atorvastatin and clopidogrel   Preferred Pharmacy: [facility]  ** Please notify patient to allow 48-72 hours to process** **Let patient know to contact pharmacy at the end of the day to make sure medication is ready. ** **If patient has not been seen in a year or longer, book an appointment **Advise to use MyChart for refill requests OR to contact their pharmacy

## 2023-01-08 ENCOUNTER — Other Ambulatory Visit: Payer: Self-pay

## 2023-01-08 DIAGNOSIS — E785 Hyperlipidemia, unspecified: Secondary | ICD-10-CM

## 2023-01-08 MED ORDER — ATORVASTATIN CALCIUM 80 MG PO TABS: 80.0000 mg | ORAL_TABLET | Freq: Every day | ORAL | 2 refills | Status: DC

## 2023-01-08 MED ORDER — CLOPIDOGREL BISULFATE 75 MG PO TABS: 75.0000 mg | ORAL_TABLET | Freq: Every day | ORAL | 0 refills | Status: DC

## 2023-01-08 NOTE — Telephone Encounter (Signed)
Done KH 

## 2023-01-15 ENCOUNTER — Telehealth: Payer: Self-pay

## 2023-01-15 NOTE — Telephone Encounter (Signed)
I will call pt latter. Luis Roth

## 2023-01-22 ENCOUNTER — Telehealth: Payer: Self-pay

## 2023-01-22 NOTE — Telephone Encounter (Signed)
Per provider pt will need to schedule an apt with cardiology. [place]

## 2023-02-11 ENCOUNTER — Ambulatory Visit (INDEPENDENT_AMBULATORY_CARE_PROVIDER_SITE_OTHER): Payer: 59 | Admitting: Nurse Practitioner

## 2023-02-11 ENCOUNTER — Encounter: Payer: Self-pay | Admitting: Nurse Practitioner

## 2023-02-11 VITALS — BP 150/93 | HR 86 | Temp 97.2°F | Ht 60.0 in | Wt 182.4 lb

## 2023-02-11 DIAGNOSIS — E785 Hyperlipidemia, unspecified: Secondary | ICD-10-CM | POA: Diagnosis not present

## 2023-02-11 DIAGNOSIS — Z23 Encounter for immunization: Secondary | ICD-10-CM | POA: Diagnosis not present

## 2023-02-11 DIAGNOSIS — F32A Depression, unspecified: Secondary | ICD-10-CM

## 2023-02-11 DIAGNOSIS — I10 Essential (primary) hypertension: Secondary | ICD-10-CM

## 2023-02-11 DIAGNOSIS — I639 Cerebral infarction, unspecified: Secondary | ICD-10-CM | POA: Diagnosis not present

## 2023-02-11 DIAGNOSIS — F419 Anxiety disorder, unspecified: Secondary | ICD-10-CM

## 2023-02-11 MED ORDER — AMLODIPINE BESYLATE 10 MG PO TABS: 10.0000 mg | ORAL_TABLET | Freq: Every day | ORAL | 1 refills | Status: DC

## 2023-02-11 MED ORDER — ATORVASTATIN CALCIUM 80 MG PO TABS: 80.0000 mg | ORAL_TABLET | Freq: Every day | ORAL | 1 refills | Status: DC

## 2023-02-11 MED ORDER — SERTRALINE HCL 50 MG PO TABS: 50.0000 mg | ORAL_TABLET | Freq: Every day | ORAL | 3 refills | Status: AC

## 2023-02-11 MED ORDER — LOSARTAN POTASSIUM 100 MG PO TABS: 100.0000 mg | ORAL_TABLET | Freq: Every day | ORAL | 1 refills | Status: DC

## 2023-02-11 NOTE — Assessment & Plan Note (Signed)
Currently on atorvastatin 80 mg daily but has been out of the medication for a while Atorvastatin 80 mg daily refilled Will recheck lipid panel in 2 months Avoid fatty fried foods

## 2023-02-11 NOTE — Assessment & Plan Note (Signed)
Patient educated on CDC recommendation for the TDAP vaccine. Verbal consent was obtained from the patient, vaccine administered by nurse, no sign of adverse reactions noted at this time. Patient education on arm soreness and use of tylenol for this patient  was discussed. Patient educated on the signs and symptoms of adverse effect and advise to contact the office if they occur.  

## 2023-02-11 NOTE — Progress Notes (Signed)
Established Patient Office Visit  Subjective:  Patient ID: Luis Roth, male    DOB: 01-05-1976  Age: 47 y.o. MRN: [id number]  CC:  Chief Complaint  Patient presents with   Follow-up    HPI Luis Roth is a 47 y.o. male  has a past medical history of CVA (cerebral vascular accident), Hyperlipidemia, Hypertension, and LVH (left ventricular hypertrophy).  Patient presents for follow-up for his chronic medical conditions.  He has completed physical therapy now walking without the use of an aid.  States that he is ready to return back to work.  Patient was referred to neurology and cardiology at his previous visits but did not follow-up with them.  Number to cardiology daily neurology office given to the patient today.  He was encouraged to call them and schedule a follow-up appointment.   Hypertension.  Currently on amlodipine 10 mg daily, losartan 100 mg daily.  Stated that he ran out of amlodipine about 2 weeks ago.  Systolic blood pressure readings at home has been in the 150s.  He denies chest pain, dizziness, edema.  Need to take all medications daily as ordered discussed.  Blood pressure was discussed.  Anxiety and depression.  Currently on Zoloft 25 mg daily.  Patient denies SI, HI.   He has received the Cologuard testing kits.  He was encouraged to get this test completed.     Past Medical History:  Diagnosis Date   CVA (cerebral vascular accident)    Hyperlipidemia    Hypertension    LVH (left ventricular hypertrophy)     Past Surgical History:  Procedure Laterality Date   ANKLE FRACTURE SURGERY Right    BUBBLE STUDY  11/06/2022   Procedure: BUBBLE STUDY;  Surgeon: Thomasene Ripple, DO;  Location: [facility];  Service: Cardiovascular;;   ORIF ANKLE FRACTURE Right 2015   TEE WITHOUT CARDIOVERSION N/A 11/06/2022   Procedure: TRANSESOPHAGEAL ECHOCARDIOGRAM (TEE);  Surgeon: Thomasene Ripple, DO;  Location: [facility];  Service: Cardiovascular;  Laterality: N/A;    Family  History  Problem Relation Age of Onset   Kidney disease Father    Stroke Brother    Heart attack Neg Hx     Social History   Socioeconomic History   Marital status: Married    Spouse name: Not on file   Number of children: 3   Years of education: Not on file   Highest education level: Not on file  Occupational History   Not on file  Tobacco Use   Smoking status: Never   Smokeless tobacco: Never  Substance and Sexual Activity   Alcohol use: Not Currently    Comment: occasionally   Drug use: Never   Sexual activity: Not on file  Other Topics Concern   Not on file  Social History Narrative   Lives with his cousin   Social Determinants of Health   Financial Resource Strain: Not on file  Food Insecurity: No Food Insecurity (11/02/2022)   Hunger Vital Sign    Worried About Running Out of Food in the Last Year: Never true    Ran Out of Food in the Last Year: Never true  Transportation Needs: No Transportation Needs (11/02/2022)   PRAPARE - Administrator, Civil Service (Medical): No    Lack of Transportation (Non-Medical): No  Physical Activity: Not on file  Stress: Not on file  Social Connections: Not on file  Intimate Partner Violence: Not At Risk (11/02/2022)   Humiliation, Afraid, Rape, and Kick questionnaire  Fear of Current or Ex-Partner: No    Emotionally Abused: No    Physically Abused: No    Sexually Abused: No    Outpatient Medications Prior to Visit  Medication Sig Dispense Refill   aspirin EC 81 MG tablet Take 1 tablet (81 mg total) by mouth daily. Swallow whole. 30 tablet 12   amLODipine (NORVASC) 10 MG tablet Take 1 tablet (10 mg total) by mouth daily.     sertraline (ZOLOFT) 25 MG tablet Take 1 tablet (25 mg total) by mouth at bedtime. 90 tablet 0   atorvastatin (LIPITOR) 80 MG tablet Take 1 tablet (80 mg total) by mouth daily. (Patient not taking: Reported on 02/11/2023) 30 tablet 2   clopidogrel (PLAVIX) 75 MG tablet Take 1 tablet (75 mg  total) by mouth at bedtime. (Patient not taking: Reported on 02/11/2023) 90 tablet 0   losartan (COZAAR) 100 MG tablet Take 1 tablet (100 mg total) by mouth daily. (Patient not taking: Reported on 02/11/2023)     No facility-administered medications prior to visit.    Allergies  Allergen Reactions   Quinine Derivatives Itching    ROS Review of Systems    Objective:    Physical Exam Constitutional:      General: He is not in acute distress.    Appearance: Normal appearance. He is not ill-appearing, toxic-appearing or diaphoretic.  Eyes:     General: No scleral icterus.       Right eye: No discharge.        Left eye: No discharge.     Extraocular Movements: Extraocular movements intact.     Conjunctiva/sclera: Conjunctivae normal.  Cardiovascular:     Rate and Rhythm: Normal rate and regular rhythm.     Pulses: Normal pulses.     Heart sounds: Normal heart sounds. No murmur heard.    No friction rub. No gallop.  Pulmonary:     Effort: Pulmonary effort is normal. No respiratory distress.     Breath sounds: Normal breath sounds. No stridor. No wheezing, rhonchi or rales.  Chest:     Chest wall: No tenderness.  Abdominal:     General: There is no distension.     Palpations: Abdomen is soft.     Tenderness: There is no abdominal tenderness. There is no guarding.  Musculoskeletal:        General: No swelling or tenderness.     Right lower leg: No edema.     Left lower leg: No edema.     Comments: Very mild weakness of left upper extremity and left lower extremity.  Power 5/5 on right upper extremity and right lower extremity  Skin:    General: Skin is warm and dry.     Capillary Refill: Capillary refill takes less than 2 seconds.     Coloration: Skin is not jaundiced.     Findings: No bruising or lesion.  Neurological:     Mental Status: He is alert and oriented to person, place, and time.     Cranial Nerves: No cranial nerve deficit.     Motor: No weakness.      Coordination: Coordination normal.     Gait: Gait normal.  Psychiatric:        Mood and Affect: Mood normal.        Behavior: Behavior normal.        Thought Content: Thought content normal.        Judgment: Judgment normal.     BP (!) 150/93  Pulse 86   Temp (!) 97.2 F (36.2 C)   Ht 5' (1.524 m)   Wt 182 lb 6.4 oz (82.7 kg)   SpO2 100%   BMI 35.62 kg/m  Wt Readings from Last 3 Encounters:  02/11/23 182 lb 6.4 oz (82.7 kg)  12/12/22 182 lb 6.4 oz (82.7 kg)  11/06/22 198 lb 6.6 oz (90 kg)    No results found for: "TSH" Lab Results  Component Value Date   WBC 5.0 11/03/2022   HGB 13.0 11/03/2022   HCT 39.1 11/03/2022   MCV 90.1 11/03/2022   PLT 218 11/03/2022   Lab Results  Component Value Date   NA 139 12/12/2022   K 4.1 12/12/2022   CO2 23 12/12/2022   GLUCOSE 100 (H) 12/12/2022   BUN 11 12/12/2022   CREATININE 1.11 12/12/2022   BILITOT 0.6 11/02/2022   ALKPHOS 76 11/02/2022   AST 32 11/02/2022   ALT 27 11/02/2022   PROT 7.5 11/02/2022   ALBUMIN 4.0 11/02/2022   CALCIUM 10.1 12/12/2022   ANIONGAP 10 11/03/2022   EGFR 82 12/12/2022   Lab Results  Component Value Date   CHOL 211 (H) 11/03/2022   Lab Results  Component Value Date   HDL 51 11/03/2022   Lab Results  Component Value Date   LDLCALC 142 (H) 11/03/2022   Lab Results  Component Value Date   TRIG 92 11/03/2022   Lab Results  Component Value Date   CHOLHDL 4.1 11/03/2022   Lab Results  Component Value Date   HGBA1C 5.3 11/03/2022      Assessment & Plan:   Problem List Items Addressed This Visit       Cardiovascular and Mediastinum   Acute CVA (cerebrovascular accident)    Continue aspirin 81 mg daily, atorvastatin 80 mg daily Follow-up with neurology as discussed Need to get blood pressure under control discussed      Relevant Medications   amLODipine (NORVASC) 10 MG tablet   losartan (COZAAR) 100 MG tablet   atorvastatin (LIPITOR) 80 MG tablet   Primary hypertension  - Primary    BP Readings from Last 3 Encounters:  02/11/23 (!) 150/93  12/12/22 117/80  11/12/22 (!) 141/92  He has been out of amlodipine for a while Amlodipine 10 mg daily, losartan 100 mg daily refilled  Discussed DASH diet and dietary sodium restrictions Continue to increase dietary efforts and exercise.  Follow-up with a nurse in 2 weeks to recheck blood pressure He was encouraged to monitor his blood pressure daily at home, blood pressure goal of less than 130/80 discussed.        Relevant Medications   amLODipine (NORVASC) 10 MG tablet   losartan (COZAAR) 100 MG tablet   atorvastatin (LIPITOR) 80 MG tablet   Other Relevant Orders   Recheck vitals     Other   Hyperlipidemia    Currently on atorvastatin 80 mg daily but has been out of the medication for a while Atorvastatin 80 mg daily refilled Will recheck lipid panel in 2 months Avoid fatty fried foods      Relevant Medications   amLODipine (NORVASC) 10 MG tablet   losartan (COZAAR) 100 MG tablet   atorvastatin (LIPITOR) 80 MG tablet   Need for diphtheria-tetanus-pertussis (Tdap) vaccine    Patient educated on CDC recommendation for the TDAP vaccine. Verbal consent was obtained from the patient, vaccine administered by nurse, no sign of adverse reactions noted at this time. Patient education on arm soreness and use  of tylenol for this patient  was discussed. Patient educated on the signs and symptoms of adverse effect and advise to contact the office if they occur.       Relevant Orders   Tdap vaccine greater than or equal to 7yo IM (Completed)   Anxiety and depression       02/11/2023    9:15 AM  GAD 7 : Generalized Anxiety Score  Nervous, Anxious, on Edge 2  Control/stop worrying 2  Worry too much - different things 2  Trouble relaxing 0  Restless 2  Easily annoyed or irritable 0  Afraid - awful might happen 3  Total GAD 7 Score 11    Flowsheet Row Office Visit from 02/11/2023 in [facility]  PHQ-9 Total Score 6     Currently on Zoloft 25 mg daily Declined referral counseling, states that he speaks with his brother daily and this has been helpful. Start Zoloft 50 mg daily      Relevant Medications   sertraline (ZOLOFT) 50 MG tablet    Meds ordered this encounter  Medications   amLODipine (NORVASC) 10 MG tablet    Sig: Take 1 tablet (10 mg total) by mouth daily.    Dispense:  90 tablet    Refill:  1   losartan (COZAAR) 100 MG tablet    Sig: Take 1 tablet (100 mg total) by mouth daily.    Dispense:  90 tablet    Refill:  1   atorvastatin (LIPITOR) 80 MG tablet    Sig: Take 1 tablet (80 mg total) by mouth daily.    Dispense:  90 tablet    Refill:  1   sertraline (ZOLOFT) 50 MG tablet    Sig: Take 1 tablet (50 mg total) by mouth daily.    Dispense:  30 tablet    Refill:  3    Follow-up: Return in about 2 months (around 04/13/2023) for Hyperlipidemia.    Donell Beers, FNP

## 2023-02-11 NOTE — Assessment & Plan Note (Signed)
Continue aspirin 81 mg daily, atorvastatin 80 mg daily Follow-up with neurology as discussed Need to get blood pressure under control discussed

## 2023-02-11 NOTE — Patient Instructions (Addendum)
Please call cardiology office  [phone], Dr Tawanna Cooler office  to reschedule your appointment . Follow up with neurology also as dicussed. [phone].   1. Acute CVA (cerebrovascular accident)   2. Primary hypertension   3. Hyperlipidemia, unspecified hyperlipidemia type   Please monitor your blood pressure at home blood pressure goal is less than 130/80  Around 3 times per week, check your blood pressure 2 times per day. once in the morning and once in the evening. The readings should be at least one minute apart. Write down these values and bring them to your next nurse visit/appointment.  When you check your BP, make sure you have been doing something calm/relaxing 5 minutes prior to checking. Both feet should be flat on the floor and you should be sitting. Use your left arm and make sure it is in a relaxed position (on a table), and that the cuff is at the approximate level/height of your heart.   It is important that you exercise regularly at least 30 minutes 5 times a week as tolerated  Think about what you will eat, plan ahead. Choose " clean, green, fresh or frozen" over canned, processed or packaged foods which are more sugary, salty and fatty. 70 to 75% of food eaten should be vegetables and fruit. Three meals at set times with snacks allowed between meals, but they must be fruit or vegetables. Aim to eat over a 12 hour period , example 7 am to 7 pm, and STOP after  your last meal of the day. Drink water,generally about 64 ounces per day, no other drink is as healthy. Fruit juice is best enjoyed in a healthy way, by EATING the fruit.  Thanks for choosing [facility] we consider it a privelige to serve you.

## 2023-02-11 NOTE — Assessment & Plan Note (Signed)
BP Readings from Last 3 Encounters:  02/11/23 (!) 150/93  12/12/22 117/80  11/12/22 (!) 141/92  He has been out of amlodipine for a while Amlodipine 10 mg daily, losartan 100 mg daily refilled  Discussed DASH diet and dietary sodium restrictions Continue to increase dietary efforts and exercise.  Follow-up with a nurse in 2 weeks to recheck blood pressure He was encouraged to monitor his blood pressure daily at home, blood pressure goal of less than 130/80 discussed.

## 2023-02-11 NOTE — Assessment & Plan Note (Signed)
    02/11/2023    9:15 AM  GAD 7 : Generalized Anxiety Score  Nervous, Anxious, on Edge 2  Control/stop worrying 2  Worry too much - different things 2  Trouble relaxing 0  Restless 2  Easily annoyed or irritable 0  Afraid - awful might happen 3  Total GAD 7 Score 11     Flowsheet Row Office Visit from 02/11/2023 in [facility]  PHQ-9 Total Score 6     Currently on Zoloft 25 mg daily Declined referral counseling, states that he speaks with his brother daily and this has been helpful. Start Zoloft 50 mg daily

## 2023-02-13 ENCOUNTER — Telehealth: Payer: Self-pay | Admitting: Nurse Practitioner

## 2023-02-13 NOTE — Telephone Encounter (Signed)
Pt called stating he was here on Monday 4/22 and got a note from work but he needs another note stating that he can return to work for "light duty" and a brief explanation of his sickness.

## 2023-02-13 NOTE — Telephone Encounter (Signed)
Note at the front desk. Gh

## 2023-02-25 ENCOUNTER — Ambulatory Visit: Payer: 59

## 2023-04-15 ENCOUNTER — Ambulatory Visit: Payer: Self-pay | Admitting: Nurse Practitioner

## 2023-05-29 ENCOUNTER — Telehealth: Payer: Self-pay

## 2023-05-29 NOTE — Telephone Encounter (Signed)
Called pt to find out if he still needed his [place] form that was left at [facility] desk. KH

## 2023-05-31 ENCOUNTER — Telehealth: Payer: Self-pay

## 2023-05-31 NOTE — Telephone Encounter (Signed)
Pt was to advise he needs to be seen and we have his [place] forms in the hold folder. KH

## 2023-07-15 ENCOUNTER — Other Ambulatory Visit: Payer: 59 | Admitting: Pharmacist

## 2023-07-15 ENCOUNTER — Other Ambulatory Visit: Payer: Self-pay | Admitting: Nurse Practitioner

## 2023-07-15 DIAGNOSIS — I10 Essential (primary) hypertension: Secondary | ICD-10-CM

## 2023-07-15 MED ORDER — LOSARTAN POTASSIUM 100 MG PO TABS: 100.0000 mg | ORAL_TABLET | Freq: Every day | ORAL | 1 refills | Status: DC

## 2023-07-15 NOTE — Progress Notes (Signed)
07/15/2023 Name: Luis Roth MRN: [id number] DOB: 07-10-1976  Patient appearing on report for [employer] [facility] - Hypertension Control report due to last documented ambulatory blood pressure of 150/93 on 02/11/23. Next appointment with PCP is not scheduled   Outreached patient to discuss hypertension control and medication management.   Current antihypertensives: amlodipine 10mg  daily, has not recently filled losartan 100mg  daily, patient is unfamiliar with this medication  Patient has an automated upper arm home BP machine.  Current blood pressure readings: does not check, he states he does not know how     Assessment/Plan: - Currently uncontrolled - - Reviewed goal blood pressure <140/90 - Reviewed appropriate administration of medication regimen - Counseled on long term microvascular and macrovascular complications of uncontrolled hypertension - Discussed dietary modifications, such as reduced salt intake, focus on whole grains, vegetables, lean proteins - Recommend patient  schedule appointment with PCP as soon as possible for review of medication regimen and routine follow up. Provided patient with PCP office number.   Lynnda Shields, PharmD, BCPS Clinical Pharmacist [facility]

## 2023-07-15 NOTE — Patient Instructions (Signed)
Jomarie Longs,  Thank you for speaking with me today - don't forget to call the office for an appointment with your primary provider!  Thank you, Elmarie Shiley, PharmD, BCPS Clinical Pharmacist [facility]

## 2023-07-26 ENCOUNTER — Encounter: Payer: Self-pay | Admitting: Nurse Practitioner

## 2023-07-26 ENCOUNTER — Ambulatory Visit (INDEPENDENT_AMBULATORY_CARE_PROVIDER_SITE_OTHER): Payer: 59 | Admitting: Nurse Practitioner

## 2023-07-26 VITALS — BP 142/83 | HR 66 | Temp 97.3°F | Wt 179.0 lb

## 2023-07-26 DIAGNOSIS — J309 Allergic rhinitis, unspecified: Secondary | ICD-10-CM | POA: Insufficient documentation

## 2023-07-26 DIAGNOSIS — E785 Hyperlipidemia, unspecified: Secondary | ICD-10-CM | POA: Diagnosis not present

## 2023-07-26 DIAGNOSIS — Z23 Encounter for immunization: Secondary | ICD-10-CM | POA: Diagnosis not present

## 2023-07-26 DIAGNOSIS — I517 Cardiomegaly: Secondary | ICD-10-CM

## 2023-07-26 DIAGNOSIS — M79605 Pain in left leg: Secondary | ICD-10-CM | POA: Insufficient documentation

## 2023-07-26 DIAGNOSIS — Z5982 Transportation insecurity: Secondary | ICD-10-CM | POA: Insufficient documentation

## 2023-07-26 DIAGNOSIS — I10 Essential (primary) hypertension: Secondary | ICD-10-CM

## 2023-07-26 DIAGNOSIS — I639 Cerebral infarction, unspecified: Secondary | ICD-10-CM

## 2023-07-26 DIAGNOSIS — R29898 Other symptoms and signs involving the musculoskeletal system: Secondary | ICD-10-CM | POA: Insufficient documentation

## 2023-07-26 MED ORDER — AMLODIPINE BESYLATE 10 MG PO TABS
10.0000 mg | ORAL_TABLET | Freq: Every day | ORAL | 1 refills | Status: DC
Start: 2023-07-26 — End: 2023-10-01

## 2023-07-26 MED ORDER — LOSARTAN POTASSIUM 100 MG PO TABS: 100.0000 mg | ORAL_TABLET | Freq: Every day | ORAL | 1 refills | Status: DC

## 2023-07-26 MED ORDER — ASPIRIN 81 MG PO TBEC
81.0000 mg | DELAYED_RELEASE_TABLET | Freq: Every day | ORAL | 2 refills | Status: DC
Start: 2023-07-26 — End: 2023-10-01

## 2023-07-26 MED ORDER — ATORVASTATIN CALCIUM 80 MG PO TABS: 80.0000 mg | ORAL_TABLET | Freq: Every day | ORAL | 1 refills | Status: DC

## 2023-07-26 MED ORDER — LORATADINE 10 MG PO TABS
10.0000 mg | ORAL_TABLET | Freq: Every day | ORAL | 11 refills | Status: AC
Start: 2023-07-26 — End: ?

## 2023-07-26 MED ORDER — AMLODIPINE BESYLATE 10 MG PO TABS: 10.0000 mg | ORAL_TABLET | Freq: Every day | ORAL | 1 refills | Status: DC

## 2023-07-26 MED ORDER — BENZONATATE 100 MG PO CAPS
100.0000 mg | ORAL_CAPSULE | Freq: Three times a day (TID) | ORAL | 0 refills | Status: AC | PRN
Start: 2023-07-26 — End: ?

## 2023-07-26 MED ORDER — ASPIRIN 81 MG PO TBEC: 81.0000 mg | DELAYED_RELEASE_TABLET | Freq: Every day | ORAL | 2 refills | Status: DC

## 2023-07-26 MED ORDER — BENZONATATE 100 MG PO CAPS
100.0000 mg | ORAL_CAPSULE | Freq: Three times a day (TID) | ORAL | 0 refills | Status: DC | PRN
Start: 2023-07-26 — End: 2023-07-26

## 2023-07-26 MED ORDER — LORATADINE 10 MG PO TABS: 10.0000 mg | ORAL_TABLET | Freq: Every day | ORAL | 11 refills | Status: DC

## 2023-07-26 NOTE — Assessment & Plan Note (Signed)
Able to ambulate using a cane, reports weakness of  the left leg Patient referred for physical therapy

## 2023-07-26 NOTE — Patient Instructions (Addendum)
  Please call cardiology office onPhone: [phone] to reschedule your missed appointment  Call neurology office on [phone] to schedule an appointment for your stroke follow-up    Acute CVA (cerebrovascular accident) [facility])  - Ambulatory referral to Physical Therapy - Ambulatory referral to Neurology - aspirin EC 81 MG tablet; Take 1 tablet (81 mg total) by mouth daily. Swallow whole.  Dispense: 90 tablet; Refill: 2 - atorvastatin (LIPITOR) 80 MG tablet; Take 1 tablet (80 mg total) by mouth daily.  Dispense: 90 tablet; Refill: 1   Primary hypertension  - Basic Metabolic Panel - amLODipine (NORVASC) 10 MG tablet; Take 1 tablet (10 mg total) by mouth daily.  Dispense: 90 tablet; Refill: 1 - losartan (COZAAR) 100 MG tablet; Take 1 tablet (100 mg total) by mouth daily.  Dispense: 90 tablet; Refill: 1     Allergic rhinitis, unspecified seasonality, unspecified trigger - benzonatate (TESSALON) 100 MG capsule; Take 1 capsule (100 mg total) by mouth 3 (three) times daily as needed for cough.  Dispense: 20 capsule; Refill: 0 - loratadine (CLARITIN) 10 MG tablet; Take 1 tablet (10 mg total) by mouth daily.  Dispense: 30 tablet; Refill: 11          Please bring all your current medications with you to your next appointment in 4 weeks  Please monitor your blood pressure at home.  Write down the numbers and bring to your next follow-up appointment.  Your blood pressure goal is less than 130/80   Please follow-up with cardiology and neurology as discussed  It is important that you exercise regularly at least 30 minutes 5 times a week as tolerated  Think about what you will eat, plan ahead. Choose " clean, green, fresh or frozen" over canned, processed or packaged foods which are more sugary, salty and fatty. 70 to 75% of food eaten should be vegetables and fruit. Three meals at set times with snacks allowed between meals, but they must be fruit or vegetables. Aim to eat over a 12  hour period , example 7 am to 7 pm, and STOP after  your last meal of the day. Drink water,generally about 64 ounces per day, no other drink is as healthy. Fruit juice is best enjoyed in a healthy way, by EATING the fruit.  Thanks for choosing [facility] we consider it a privelige to serve you.

## 2023-07-26 NOTE — Progress Notes (Signed)
Established Patient Office Visit  Subjective:  Patient ID: Luis Roth, male    DOB: 1976-07-20  Age: 47 y.o. MRN: [id number]  CC:  Chief Complaint  Patient presents with   Cough    Dry for the past two weeks  was just started on bp medication    HPI Luis Roth is a 47 y.o. male  has a past medical history of CVA (cerebral vascular accident) (HCC), Hyperlipidemia, Hypertension, and LVH (left ventricular hypertrophy).  Patient presents for follow-up for his chronic medical conditions.  Patient stated that he has not been following up regularly due to lack of transportation.  Hypertension.  Currently has amlodipine 10 mg, losartan 100 mg daily ordered.  No chest pain, shortness of breath, edema.  Reports home blood pressure readings of 140/100.  He was supposed to follow-up with cardiologist for his left ventricular hypertrophy but he did not keep the appointment  CVA.  Has atorvastatin 80 mg daily, aspirin 81 mg daily ordered.  Patient reports left leg pain and weakness, using a cane for ambulation.  He works at [employer] does a lot of standing throughout his shift  Allergic rhinitis.  Patient complains of itchy eyes, sneezing, stuffy nose, dry cough for the past 2 weeks.  No fever, shortness of breath, wheezing   He has received the Cologuard testing kit but has not completed the test.  Patient encouraged to complete the test as planned.  Patient encouraged to bring all his current medications with him to his next appointment  Past Medical History:  Diagnosis Date   CVA (cerebral vascular accident) (HCC)    Hyperlipidemia    Hypertension    LVH (left ventricular hypertrophy)     Past Surgical History:  Procedure Laterality Date   ANKLE FRACTURE SURGERY Right    BUBBLE STUDY  11/06/2022   Procedure: BUBBLE STUDY;  Surgeon: Thomasene Ripple, DO;  Location: [facility];  Service: Cardiovascular;;   ORIF ANKLE FRACTURE Right 2015   TEE WITHOUT CARDIOVERSION N/A 11/06/2022    Procedure: TRANSESOPHAGEAL ECHOCARDIOGRAM (TEE);  Surgeon: Thomasene Ripple, DO;  Location: [facility] ENDOSCOPY;  Service: Cardiovascular;  Laterality: N/A;    Family History  Problem Relation Age of Onset   Kidney disease Father    Stroke Brother    Heart attack Neg Hx     Social History   Socioeconomic History   Marital status: Married    Spouse name: Not on file   Number of children: 3   Years of education: Not on file   Highest education level: Not on file  Occupational History   Not on file  Tobacco Use   Smoking status: Never   Smokeless tobacco: Never  Substance and Sexual Activity   Alcohol use: Not Currently    Comment: occasionally   Drug use: Never   Sexual activity: Not on file  Other Topics Concern   Not on file  Social History Narrative   Lives with his cousin   Social Determinants of Health   Financial Resource Strain: Not on file  Food Insecurity: No Food Insecurity (11/02/2022)   Hunger Vital Sign    Worried About Running Out of Food in the Last Year: Never true    Ran Out of Food in the Last Year: Never true  Transportation Needs: No Transportation Needs (11/02/2022)   PRAPARE - Administrator, Civil Service (Medical): No    Lack of Transportation (Non-Medical): No  Physical Activity: Not on file  Stress: Not on  file  Social Connections: Not on file  Intimate Partner Violence: Not At Risk (11/02/2022)   Humiliation, Afraid, Rape, and Kick questionnaire    Fear of Current or Ex-Partner: No    Emotionally Abused: No    Physically Abused: No    Sexually Abused: No    Outpatient Medications Prior to Visit  Medication Sig Dispense Refill   amLODipine (NORVASC) 10 MG tablet Take 1 tablet (10 mg total) by mouth daily. 90 tablet 1   aspirin EC 81 MG tablet Take 1 tablet (81 mg total) by mouth daily. Swallow whole. 30 tablet 12   atorvastatin (LIPITOR) 80 MG tablet Take 1 tablet (80 mg total) by mouth daily. 90 tablet 1   losartan (COZAAR) 100 MG  tablet Take 1 tablet (100 mg total) by mouth daily. 90 tablet 1   sertraline (ZOLOFT) 50 MG tablet Take 1 tablet (50 mg total) by mouth daily. (Patient not taking: Reported on 07/26/2023) 30 tablet 3   No facility-administered medications prior to visit.    Allergies  Allergen Reactions   Quinine Derivatives Itching    ROS Review of Systems  Constitutional:  Negative for activity change, appetite change, chills, diaphoresis, fatigue, fever and unexpected weight change.  HENT:  Positive for congestion and sneezing. Negative for dental problem, drooling and ear discharge.   Eyes:  Positive for itching. Negative for pain, discharge and redness.  Respiratory:  Positive for cough. Negative for apnea, choking, chest tightness, shortness of breath and wheezing.   Cardiovascular: Negative.  Negative for chest pain, palpitations and leg swelling.  Gastrointestinal:  Negative for abdominal distention, abdominal pain, anal bleeding, blood in stool, constipation, diarrhea and vomiting.  Endocrine: Negative for polydipsia, polyphagia and polyuria.  Genitourinary:  Negative for difficulty urinating, flank pain, frequency and genital sores.  Musculoskeletal:  Positive for arthralgias and gait problem. Negative for back pain and joint swelling.  Skin:  Negative for color change, pallor and rash.  Neurological:  Positive for weakness. Negative for dizziness, facial asymmetry, light-headedness, numbness and headaches.  Psychiatric/Behavioral:  Negative for agitation, behavioral problems, confusion, hallucinations, self-injury, sleep disturbance and suicidal ideas.       Objective:    Physical Exam Vitals and nursing note reviewed.  Constitutional:      General: He is not in acute distress.    Appearance: Normal appearance. He is not ill-appearing, toxic-appearing or diaphoretic.  HENT:     Nose: Congestion present. No rhinorrhea.     Mouth/Throat:     Mouth: Mucous membranes are moist.      Pharynx: Oropharynx is clear. No oropharyngeal exudate or posterior oropharyngeal erythema.  Eyes:     General: No scleral icterus.       Right eye: No discharge.        Left eye: No discharge.     Extraocular Movements: Extraocular movements intact.     Conjunctiva/sclera: Conjunctivae normal.  Cardiovascular:     Rate and Rhythm: Normal rate and regular rhythm.     Pulses: Normal pulses.     Heart sounds: Normal heart sounds. No murmur heard.    No friction rub. No gallop.  Pulmonary:     Effort: Pulmonary effort is normal. No respiratory distress.     Breath sounds: Normal breath sounds. No stridor. No wheezing, rhonchi or rales.  Chest:     Chest wall: No tenderness.  Abdominal:     General: There is no distension.     Palpations: Abdomen is soft.  Tenderness: There is no abdominal tenderness. There is no right CVA tenderness, left CVA tenderness or guarding.  Musculoskeletal:        General: No swelling, tenderness, deformity or signs of injury.     Right lower leg: No edema.     Left lower leg: No edema.     Comments: Skin warm and dry has palpable pedal pulses.  Ambulating using a cane.   Skin:    General: Skin is warm and dry.     Capillary Refill: Capillary refill takes less than 2 seconds.     Coloration: Skin is not jaundiced or pale.     Findings: No bruising, erythema or lesion.  Neurological:     Mental Status: He is alert and oriented to person, place, and time.     Motor: No weakness.     Coordination: Coordination normal.     Gait: Gait abnormal.  Psychiatric:        Mood and Affect: Mood normal.        Behavior: Behavior normal.        Thought Content: Thought content normal.        Judgment: Judgment normal.     BP (!) 142/83   Pulse 66   Temp (!) 97.3 F (36.3 C)   Wt 179 lb (81.2 kg)   SpO2 100%   BMI 34.96 kg/m  Wt Readings from Last 3 Encounters:  07/26/23 179 lb (81.2 kg)  02/11/23 182 lb 6.4 oz (82.7 kg)  12/12/22 182 lb 6.4 oz (82.7  kg)    No results found for: "TSH" Lab Results  Component Value Date   WBC 5.0 11/03/2022   HGB 13.0 11/03/2022   HCT 39.1 11/03/2022   MCV 90.1 11/03/2022   PLT 218 11/03/2022   Lab Results  Component Value Date   NA 139 12/12/2022   K 4.1 12/12/2022   CO2 23 12/12/2022   GLUCOSE 100 (H) 12/12/2022   BUN 11 12/12/2022   CREATININE 1.11 12/12/2022   BILITOT 0.6 11/02/2022   ALKPHOS 76 11/02/2022   AST 32 11/02/2022   ALT 27 11/02/2022   PROT 7.5 11/02/2022   ALBUMIN 4.0 11/02/2022   CALCIUM 10.1 12/12/2022   ANIONGAP 10 11/03/2022   EGFR 82 12/12/2022   Lab Results  Component Value Date   CHOL 211 (H) 11/03/2022   Lab Results  Component Value Date   HDL 51 11/03/2022   Lab Results  Component Value Date   LDLCALC 142 (H) 11/03/2022   Lab Results  Component Value Date   TRIG 92 11/03/2022   Lab Results  Component Value Date   CHOLHDL 4.1 11/03/2022   Lab Results  Component Value Date   HGBA1C 5.3 11/03/2022      Assessment & Plan:   Problem List Items Addressed This Visit       Cardiovascular and Mediastinum   Acute CVA (cerebrovascular accident) (HCC)    I doubt his medication compliance Did not follow-up with neurology Patient encouraged to call the office and follow-up as planned Need to take atorvastatin 80 mg daily, aspirin 81 mg daily discussed Importance of getting blood pressure under control also discussed Follow-up in the office in 4 weeks       Relevant Medications   amLODipine (NORVASC) 10 MG tablet   aspirin EC 81 MG tablet   atorvastatin (LIPITOR) 80 MG tablet   losartan (COZAAR) 100 MG tablet   Other Relevant Orders   Ambulatory referral to Physical  Therapy   Ambulatory referral to Neurology   Primary hypertension    BP Readings from Last 3 Encounters:  07/26/23 (!) 142/83  02/11/23 (!) 150/93  12/12/22 117/80  Blood pressure goal is less than 130/80. I doubt patient's medication compliance Was encouraged to take  amlodipine 10 mg daily, losartan 100 mg daily as ordered Monitor blood pressure at home keep a log and bring to next visit Discussed DASH diet and dietary sodium restrictions Continue to increase dietary efforts and exercise.  Will refer patient to the clinical pharmacist to assist with medication adherence CMP today        Relevant Medications   amLODipine (NORVASC) 10 MG tablet   aspirin EC 81 MG tablet   atorvastatin (LIPITOR) 80 MG tablet   losartan (COZAAR) 100 MG tablet   Other Relevant Orders   Basic Metabolic Panel   LVH, severe    Patient denies chest pain, dizziness He was encouraged to schedule an appointment and follow-up with cardiology for follow-up      Relevant Medications   amLODipine (NORVASC) 10 MG tablet   aspirin EC 81 MG tablet   atorvastatin (LIPITOR) 80 MG tablet   losartan (COZAAR) 100 MG tablet     Respiratory   Allergic rhinitis    Start Claritin 10 mg daily for allergic rhinitis Take Tessalon 100 mg 3 times daily as needed cough      Relevant Medications   benzonatate (TESSALON) 100 MG capsule   loratadine (CLARITIN) 10 MG tablet     Nervous and Auditory   Left leg weakness    Able to ambulate using a cane, reports weakness of  the left leg Patient referred for physical therapy      Relevant Orders   Ambulatory referral to Physical Therapy     Other   Hyperlipidemia    LDL goal is less than 55 Atorvastatin 80 mg daily ordered but I doubt his medication compliance Patient encouraged to take medication daily as ordered Will obtain a direct LDL today Follow-up in 4 weeks      Relevant Medications   amLODipine (NORVASC) 10 MG tablet   aspirin EC 81 MG tablet   atorvastatin (LIPITOR) 80 MG tablet   losartan (COZAAR) 100 MG tablet   Other Relevant Orders   LDL Cholesterol, Direct   Need for influenza vaccination - Primary    Patient educated on CDC recommendation for the vaccine. Verbal consent was obtained from the patient, vaccine  administered by nurse, no sign of adverse reactions noted at this time. Patient education on arm soreness and use of tylenol o for this patient  was discussed. Patient educated on the signs and symptoms of adverse effect and advise to contact the office if they occur. Vaccine information sheet given to patient.        Relevant Orders   Flu vaccine trivalent PF, 6mos and older(Flulaval,Afluria,Fluarix,Fluzone) (Completed)   Left leg pain    Patient referred for physical therapy We discussed taking Tylenol 650 mg every 6 hours as needed for pain      Relevant Orders   Ambulatory referral to Physical Therapy   Lack of access to transportation    Currently not able to drive, came to the office today via uber, Will discus applying  for SCAT at next visit  Patient encouraged to contact his insurance company for assistance with transportation for his medical appointments.       Meds ordered this encounter  Medications   DISCONTD: amLODipine (  NORVASC) 10 MG tablet    Sig: Take 1 tablet (10 mg total) by mouth daily.    Dispense:  90 tablet    Refill:  1   DISCONTD: aspirin EC 81 MG tablet    Sig: Take 1 tablet (81 mg total) by mouth daily. Swallow whole.    Dispense:  90 tablet    Refill:  2   DISCONTD: atorvastatin (LIPITOR) 80 MG tablet    Sig: Take 1 tablet (80 mg total) by mouth daily.    Dispense:  90 tablet    Refill:  1   DISCONTD: losartan (COZAAR) 100 MG tablet    Sig: Take 1 tablet (100 mg total) by mouth daily.    Dispense:  90 tablet    Refill:  1   DISCONTD: loratadine (CLARITIN) 10 MG tablet    Sig: Take 1 tablet (10 mg total) by mouth daily.    Dispense:  30 tablet    Refill:  11   DISCONTD: benzonatate (TESSALON) 100 MG capsule    Sig: Take 1 capsule (100 mg total) by mouth 3 (three) times daily as needed for cough.    Dispense:  20 capsule    Refill:  0   amLODipine (NORVASC) 10 MG tablet    Sig: Take 1 tablet (10 mg total) by mouth daily.    Dispense:  90  tablet    Refill:  1   aspirin EC 81 MG tablet    Sig: Take 1 tablet (81 mg total) by mouth daily. Swallow whole.    Dispense:  90 tablet    Refill:  2   atorvastatin (LIPITOR) 80 MG tablet    Sig: Take 1 tablet (80 mg total) by mouth daily.    Dispense:  90 tablet    Refill:  1   benzonatate (TESSALON) 100 MG capsule    Sig: Take 1 capsule (100 mg total) by mouth 3 (three) times daily as needed for cough.    Dispense:  20 capsule    Refill:  0   loratadine (CLARITIN) 10 MG tablet    Sig: Take 1 tablet (10 mg total) by mouth daily.    Dispense:  30 tablet    Refill:  11   losartan (COZAAR) 100 MG tablet    Sig: Take 1 tablet (100 mg total) by mouth daily.    Dispense:  90 tablet    Refill:  1    Follow-up: Return in about 4 weeks (around 08/23/2023).    Donell Beers, FNP

## 2023-07-26 NOTE — Assessment & Plan Note (Signed)
LDL goal is less than 55 Atorvastatin 80 mg daily ordered but I doubt his medication compliance Patient encouraged to take medication daily as ordered Will obtain a direct LDL today Follow-up in 4 weeks

## 2023-07-26 NOTE — Assessment & Plan Note (Signed)
Patient referred for physical therapy We discussed taking Tylenol 650 mg every 6 hours as needed for pain

## 2023-07-26 NOTE — Assessment & Plan Note (Addendum)
I doubt his medication compliance Did not follow-up with neurology Patient encouraged to call the office and follow-up as planned Need to take atorvastatin 80 mg daily, aspirin 81 mg daily discussed Importance of getting blood pressure under control also discussed Follow-up in the office in 4 weeks

## 2023-07-26 NOTE — Assessment & Plan Note (Signed)
Patient educated on CDC recommendation for the vaccine. Verbal consent was obtained from the patient, vaccine administered by nurse, no sign of adverse reactions noted at this time. Patient education on arm soreness and use of tylenol o for this patient  was discussed. Patient educated on the signs and symptoms of adverse effect and advise to contact the office if they occur. Vaccine information sheet given to patient.

## 2023-07-26 NOTE — Assessment & Plan Note (Signed)
Start Claritin 10 mg daily for allergic rhinitis Take Tessalon 100 mg 3 times daily as needed cough

## 2023-07-26 NOTE — Assessment & Plan Note (Signed)
Patient denies chest pain, dizziness He was encouraged to schedule an appointment and follow-up with cardiology for follow-up

## 2023-07-26 NOTE — Assessment & Plan Note (Signed)
Currently not able to drive, came to the office today via uber, Will discus applying  for SCAT at next visit  Patient encouraged to contact his insurance company for assistance with transportation for his medical appointments.

## 2023-07-26 NOTE — Assessment & Plan Note (Addendum)
BP Readings from Last 3 Encounters:  07/26/23 (!) 142/83  02/11/23 (!) 150/93  12/12/22 117/80  Blood pressure goal is less than 130/80. I doubt patient's medication compliance Was encouraged to take amlodipine 10 mg daily, losartan 100 mg daily as ordered Monitor blood pressure at home keep a log and bring to next visit Discussed DASH diet and dietary sodium restrictions Continue to increase dietary efforts and exercise.  Will refer patient to the clinical pharmacist to assist with medication adherence CMP today

## 2023-07-27 LAB — BASIC METABOLIC PANEL
BUN/Creatinine Ratio: 8 — ABNORMAL LOW (ref 9–20)
BUN: 9 mg/dL (ref 6–24)
CO2: 21 mmol/L (ref 20–29)
Calcium: 9.4 mg/dL (ref 8.7–10.2)
Chloride: 106 mmol/L (ref 96–106)
Creatinine, Ser: 1.19 mg/dL (ref 0.76–1.27)
Glucose: 121 mg/dL — ABNORMAL HIGH (ref 70–99)
Potassium: 3.7 mmol/L (ref 3.5–5.2)
Sodium: 142 mmol/L (ref 134–144)
eGFR: 76 mL/min/{1.73_m2} (ref >59)

## 2023-07-27 LAB — LDL CHOLESTEROL, DIRECT: LDL Direct: 51 mg/dL (ref 0–99)

## 2023-07-29 ENCOUNTER — Other Ambulatory Visit: Payer: Self-pay | Admitting: Nurse Practitioner

## 2023-07-29 ENCOUNTER — Telehealth: Payer: Self-pay

## 2023-07-29 DIAGNOSIS — R739 Hyperglycemia, unspecified: Secondary | ICD-10-CM

## 2023-07-29 NOTE — Progress Notes (Signed)
   Care Guide Note  07/29/2023 Name: Luis Roth MRN: [id number] DOB: 1975-11-13  Referred by: Donell Beers, FNP Reason for referral : Care Coordination (Outreach to schedule with Pharm d )   Luis Roth is a 47 y.o. year old male who is a primary care patient of Donell Beers, FNP. Luis Roth was referred to the pharmacist for assistance related to DM.    An unsuccessful telephone outreach was attempted today to contact the patient who was referred to the pharmacy team for assistance with medication assistance. Additional attempts will be made to contact the patient.   Penne Lash, RMA Care Guide [facility]  [place], [place] [address] Direct Dial: [phone] [email or web address]

## 2023-07-31 NOTE — Progress Notes (Signed)
   Care Guide Note  07/31/2023 Name: [name] MRN: [id number] DOB: 11-24-1975  Referred by: Donell Beers, FNP Reason for referral : Care Coordination (Outreach to schedule with Pharm d )   Luis Roth is a 47 y.o. year old male who is a primary care patient of Donell Beers, FNP. Shakir Petrosino was referred to the pharmacist for assistance related to DM.    A second unsuccessful telephone outreach was attempted today to contact the patient who was referred to the pharmacy team for assistance with medication management. Additional attempts will be made to contact the patient. Penne Lash, RMA Care Guide [facility]  [place], [place] [address] Direct Dial: [phone] [email or web address]

## 2023-08-02 NOTE — Progress Notes (Signed)
   Care Guide Note  08/02/2023 Name: Luis Roth MRN: [id number] DOB: [birth year]  Referred by: Donell Beers, FNP Reason for referral : Care Coordination (Outreach to schedule with Pharm d )   Luis Roth is a 47 y.o. year old male who is a primary care patient of Donell Beers, FNP. Luis Roth was referred to the pharmacist for assistance related to DM.    A third unsuccessful telephone outreach was attempted today to contact the patient who was referred to the pharmacy team for assistance with medication management. The Population Health team is pleased to engage with this patient at any time in the future upon receipt of referral and should he/she be interested in assistance from the [facility] team.   Penne Lash, RMA Care Guide [facility]  [place], [place] [address] Direct Dial: [phone] [email or web address]

## 2023-08-06 ENCOUNTER — Ambulatory Visit: Payer: 59 | Attending: Nurse Practitioner | Admitting: Physical Therapy

## 2023-08-23 ENCOUNTER — Ambulatory Visit: Payer: Self-pay | Admitting: Nurse Practitioner

## 2023-09-30 ENCOUNTER — Inpatient Hospital Stay: Payer: 59 | Admitting: Nurse Practitioner

## 2023-10-01 ENCOUNTER — Encounter: Payer: Self-pay | Admitting: Nurse Practitioner

## 2023-10-01 ENCOUNTER — Ambulatory Visit: Payer: 59 | Admitting: Nurse Practitioner

## 2023-10-01 VITALS — BP 132/81 | HR 72 | Ht 67.0 in | Wt 176.4 lb

## 2023-10-01 DIAGNOSIS — R29898 Other symptoms and signs involving the musculoskeletal system: Secondary | ICD-10-CM | POA: Diagnosis not present

## 2023-10-01 DIAGNOSIS — I10 Essential (primary) hypertension: Secondary | ICD-10-CM

## 2023-10-01 DIAGNOSIS — I639 Cerebral infarction, unspecified: Secondary | ICD-10-CM

## 2023-10-01 DIAGNOSIS — M792 Neuralgia and neuritis, unspecified: Secondary | ICD-10-CM | POA: Diagnosis not present

## 2023-10-01 DIAGNOSIS — M79605 Pain in left leg: Secondary | ICD-10-CM | POA: Diagnosis not present

## 2023-10-01 DIAGNOSIS — M79602 Pain in left arm: Secondary | ICD-10-CM

## 2023-10-01 DIAGNOSIS — I517 Cardiomegaly: Secondary | ICD-10-CM

## 2023-10-01 DIAGNOSIS — H538 Other visual disturbances: Secondary | ICD-10-CM | POA: Insufficient documentation

## 2023-10-01 MED ORDER — GABAPENTIN 100 MG PO CAPS: 100.0000 mg | ORAL_CAPSULE | Freq: Three times a day (TID) | ORAL | 3 refills | Status: AC

## 2023-10-01 MED ORDER — ASPIRIN 81 MG PO TBEC: 81.0000 mg | DELAYED_RELEASE_TABLET | Freq: Every day | ORAL | 2 refills | Status: AC

## 2023-10-01 MED ORDER — AMLODIPINE BESYLATE 10 MG PO TABS: 10.0000 mg | ORAL_TABLET | Freq: Every day | ORAL | 1 refills | Status: AC

## 2023-10-01 MED ORDER — ATORVASTATIN CALCIUM 80 MG PO TABS
80.0000 mg | ORAL_TABLET | Freq: Every day | ORAL | 1 refills | Status: AC
Start: 2023-10-01 — End: 2024-03-29

## 2023-10-01 MED ORDER — LOSARTAN POTASSIUM 100 MG PO TABS: 100.0000 mg | ORAL_TABLET | Freq: Every day | ORAL | 1 refills | Status: AC

## 2023-10-01 NOTE — Assessment & Plan Note (Signed)
BP Readings from Last 3 Encounters:  10/01/23 132/81  07/26/23 (!) 142/83  02/11/23 (!) 150/93    Continue amlodipine 10 mg daily, losartan 100 mg daily Continue current medications. No changes in management. Discussed DASH diet and dietary sodium restrictions Continue to increase dietary efforts and exercise.

## 2023-10-01 NOTE — Assessment & Plan Note (Signed)
Ambulatory referral to Physical Therapy - gabapentin (NEURONTIN) 100 MG capsule; Take 1 capsule (100 mg total) by mouth 3 (three) times daily.  Dispense: 90 capsule; Refill: 3

## 2023-10-01 NOTE — Assessment & Plan Note (Signed)
Patient referred to ophthalmology

## 2023-10-01 NOTE — Assessment & Plan Note (Signed)
-   Ambulatory referral to Physical Therapy - gabapentin (NEURONTIN) 100 MG capsule; Take 1 capsule (100 mg total) by mouth 3 (three) times daily.  Dispense: 90 capsule; Refill: 3

## 2023-10-01 NOTE — Assessment & Plan Note (Signed)
Patient referred to physical therapy For a number for physical therapy also provided he was encouraged to call them and schedule an appointment

## 2023-10-01 NOTE — Assessment & Plan Note (Signed)
Again patient reminded to follow-up with cardiology verbalized understanding

## 2023-10-01 NOTE — Progress Notes (Signed)
Established Patient Office Visit  Subjective:  Patient ID: Luis Roth, male    DOB: 06-06-1976  Age: 47 y.o. MRN: [id number]  CC:  Chief Complaint  Patient presents with   Leg Pain    LEFT LEG PAIN, LEFT ARM PAIN, BLURRY VISION    HPI Luis Roth is a 47 y.o. male  has a past medical history of CVA (cerebral vascular accident) (HCC), Hyperlipidemia, Hypertension, and LVH (left ventricular hypertrophy).  Patient presents for follow-up for his chronic medical conditions  Hypertension.  Currently on amlodipine 10 mg daily, losartan 100 mg daily.  Patient denies shortness of breath chest pain dizziness  History of CVA.  Currently on aspirin 81 mg daily, atorvastatin 80 mg daily, patient complains of a burning pain on his left upper extremity and left lower extremity.  He also complains of left-sided weakness since he had his stroke.  Patient was referred to physical therapy but he canceled his appointments.  Patient is now willing to go for physical therapy     Patient did not bring his medications with him today, he was encouraged to bring his medication for all his appointment.    Past Medical History:  Diagnosis Date   CVA (cerebral vascular accident) (HCC)    Hyperlipidemia    Hypertension    LVH (left ventricular hypertrophy)     Past Surgical History:  Procedure Laterality Date   ANKLE FRACTURE SURGERY Right    BUBBLE STUDY  11/06/2022   Procedure: BUBBLE STUDY;  Surgeon: Thomasene Ripple, DO;  Location: [facility];  Service: Cardiovascular;;   ORIF ANKLE FRACTURE Right 2015   TEE WITHOUT CARDIOVERSION N/A 11/06/2022   Procedure: TRANSESOPHAGEAL ECHOCARDIOGRAM (TEE);  Surgeon: Thomasene Ripple, DO;  Location: [facility];  Service: Cardiovascular;  Laterality: N/A;    Family History  Problem Relation Age of Onset   Kidney disease Father    Stroke Brother    Heart attack Neg Hx     Social History   Socioeconomic History   Marital status: Single    Spouse  name: Not on file   Number of children: 3   Years of education: Not on file   Highest education level: Not on file  Occupational History   Not on file  Tobacco Use   Smoking status: Never   Smokeless tobacco: Never  Substance and Sexual Activity   Alcohol use: Not Currently    Comment: occasionally   Drug use: Never   Sexual activity: Not on file  Other Topics Concern   Not on file  Social History Narrative   Lives with his cousin   Social Determinants of Health   Financial Resource Strain: Not on file  Food Insecurity: No Food Insecurity (11/02/2022)   Hunger Vital Sign    Worried About Running Out of Food in the Last Year: Never true    Ran Out of Food in the Last Year: Never true  Transportation Needs: No Transportation Needs (11/02/2022)   PRAPARE - Administrator, Civil Service (Medical): No    Lack of Transportation (Non-Medical): No  Physical Activity: Not on file  Stress: Not on file  Social Connections: Not on file  Intimate Partner Violence: Not At Risk (11/02/2022)   Humiliation, Afraid, Rape, and Kick questionnaire    Fear of Current or Ex-Partner: No    Emotionally Abused: No    Physically Abused: No    Sexually Abused: No    Outpatient Medications Prior to Visit  Medication Sig  Dispense Refill   benzonatate (TESSALON) 100 MG capsule Take 1 capsule (100 mg total) by mouth 3 (three) times daily as needed for cough. 20 capsule 0   loratadine (CLARITIN) 10 MG tablet Take 1 tablet (10 mg total) by mouth daily. 30 tablet 11   sertraline (ZOLOFT) 50 MG tablet Take 1 tablet (50 mg total) by mouth daily. 30 tablet 3   amLODipine (NORVASC) 10 MG tablet Take 1 tablet (10 mg total) by mouth daily. 90 tablet 1   aspirin EC 81 MG tablet Take 1 tablet (81 mg total) by mouth daily. Swallow whole. 90 tablet 2   atorvastatin (LIPITOR) 80 MG tablet Take 1 tablet (80 mg total) by mouth daily. 90 tablet 1   losartan (COZAAR) 100 MG tablet Take 1 tablet (100 mg total)  by mouth daily. 90 tablet 1   No facility-administered medications prior to visit.    Allergies  Allergen Reactions   Quinine Derivatives Itching    ROS Review of Systems  Constitutional:  Negative for appetite change, chills, fatigue and fever.  HENT:  Negative for congestion, postnasal drip, rhinorrhea and sneezing.   Eyes:  Positive for visual disturbance. Negative for pain, discharge and itching.  Respiratory:  Negative for cough, shortness of breath and wheezing.   Cardiovascular:  Negative for chest pain, palpitations and leg swelling.  Gastrointestinal:  Negative for abdominal pain, constipation, nausea and vomiting.  Genitourinary:  Negative for difficulty urinating, dysuria, flank pain and frequency.  Musculoskeletal:  Negative for arthralgias, back pain, joint swelling and myalgias.  Skin:  Negative for color change, pallor, rash and wound.  Neurological:  Negative for dizziness, facial asymmetry, weakness, numbness and headaches.  Psychiatric/Behavioral:  Negative for behavioral problems, confusion, self-injury and suicidal ideas.       Objective:    Physical Exam Vitals and nursing note reviewed.  Constitutional:      General: He is not in acute distress.    Appearance: Normal appearance. He is not ill-appearing, toxic-appearing or diaphoretic.  HENT:     Mouth/Throat:     Mouth: Mucous membranes are moist.     Pharynx: Oropharynx is clear. No oropharyngeal exudate or posterior oropharyngeal erythema.  Eyes:     General: No scleral icterus.       Right eye: No discharge.        Left eye: No discharge.     Extraocular Movements: Extraocular movements intact.     Conjunctiva/sclera: Conjunctivae normal.  Cardiovascular:     Rate and Rhythm: Normal rate and regular rhythm.     Pulses: Normal pulses.     Heart sounds: Normal heart sounds. No murmur heard.    No friction rub. No gallop.  Pulmonary:     Effort: Pulmonary effort is normal. No respiratory  distress.     Breath sounds: Normal breath sounds. No stridor. No wheezing, rhonchi or rales.  Chest:     Chest wall: No tenderness.  Abdominal:     General: There is no distension.     Palpations: Abdomen is soft.     Tenderness: There is no abdominal tenderness. There is no right CVA tenderness, left CVA tenderness or guarding.  Musculoskeletal:        General: No swelling, tenderness, deformity or signs of injury.     Right lower leg: No edema.     Left lower leg: No edema.     Comments: Power 4/5 left upper extremity and left lower extremity, power 5/5 right upper extremity  and right lower extremity  Skin:    General: Skin is warm and dry.     Capillary Refill: Capillary refill takes 2 to 3 seconds.     Coloration: Skin is not jaundiced or pale.     Findings: No bruising, erythema or lesion.  Neurological:     Mental Status: He is alert and oriented to person, place, and time.     Motor: No weakness.     Coordination: Coordination normal.     Gait: Gait normal.  Psychiatric:        Mood and Affect: Mood normal.        Behavior: Behavior normal.        Thought Content: Thought content normal.        Judgment: Judgment normal.     BP 132/81   Pulse 72   Ht 5\' 7"  (1.702 m)   Wt 176 lb 6.4 oz (80 kg)   SpO2 100%   BMI 27.63 kg/m  Wt Readings from Last 3 Encounters:  10/01/23 176 lb 6.4 oz (80 kg)  07/26/23 179 lb (81.2 kg)  02/11/23 182 lb 6.4 oz (82.7 kg)    No results found for: "TSH" Lab Results  Component Value Date   WBC 5.0 11/03/2022   HGB 13.0 11/03/2022   HCT 39.1 11/03/2022   MCV 90.1 11/03/2022   PLT 218 11/03/2022   Lab Results  Component Value Date   NA 142 07/26/2023   K 3.7 07/26/2023   CO2 21 07/26/2023   GLUCOSE 121 (H) 07/26/2023   BUN 9 07/26/2023   CREATININE 1.19 07/26/2023   BILITOT 0.6 11/02/2022   ALKPHOS 76 11/02/2022   AST 32 11/02/2022   ALT 27 11/02/2022   PROT 7.5 11/02/2022   ALBUMIN 4.0 11/02/2022   CALCIUM 9.4  07/26/2023   ANIONGAP 10 11/03/2022   EGFR 76 07/26/2023   Lab Results  Component Value Date   CHOL 211 (H) 11/03/2022   Lab Results  Component Value Date   HDL 51 11/03/2022   Lab Results  Component Value Date   LDLCALC 142 (H) 11/03/2022   Lab Results  Component Value Date   TRIG 92 11/03/2022   Lab Results  Component Value Date   CHOLHDL 4.1 11/03/2022   Lab Results  Component Value Date   HGBA1C 5.3 11/03/2022      Assessment & Plan:   Problem List Items Addressed This Visit       Cardiovascular and Mediastinum   Acute CVA (cerebrovascular accident) (HCC)   Relevant Medications   losartan (COZAAR) 100 MG tablet   atorvastatin (LIPITOR) 80 MG tablet   amLODipine (NORVASC) 10 MG tablet   aspirin EC 81 MG tablet   Primary hypertension    BP Readings from Last 3 Encounters:  10/01/23 132/81  07/26/23 (!) 142/83  02/11/23 (!) 150/93    Continue amlodipine 10 mg daily, losartan 100 mg daily Continue current medications. No changes in management. Discussed DASH diet and dietary sodium restrictions Continue to increase dietary efforts and exercise.         Relevant Medications   losartan (COZAAR) 100 MG tablet   atorvastatin (LIPITOR) 80 MG tablet   amLODipine (NORVASC) 10 MG tablet   aspirin EC 81 MG tablet   LVH, severe    Again patient reminded to follow-up with cardiology verbalized understanding      Relevant Medications   losartan (COZAAR) 100 MG tablet   atorvastatin (LIPITOR) 80 MG tablet  amLODipine (NORVASC) 10 MG tablet   aspirin EC 81 MG tablet     Nervous and Auditory   Left leg weakness    Patient referred to physical therapy For a number for physical therapy also provided he was encouraged to call them and schedule an appointment        Other   Left leg pain     - Ambulatory referral to Physical Therapy - gabapentin (NEURONTIN) 100 MG capsule; Take 1 capsule (100 mg total) by mouth 3 (three) times daily.  Dispense: 90  capsule; Refill: 3       Relevant Medications   gabapentin (NEURONTIN) 100 MG capsule   Other Relevant Orders   Ambulatory referral to Physical Therapy   Neuropathic pain - Primary    Ambulatory referral to Physical Therapy - gabapentin (NEURONTIN) 100 MG capsule; Take 1 capsule (100 mg total) by mouth 3 (three) times daily.  Dispense: 90 capsule; Refill: 3       Relevant Medications   gabapentin (NEURONTIN) 100 MG capsule   Other Relevant Orders   Ambulatory referral to Physical Therapy   Blurry vision, bilateral    Patient referred to ophthalmology      Relevant Orders   Ambulatory referral to Ophthalmology    Meds ordered this encounter  Medications   gabapentin (NEURONTIN) 100 MG capsule    Sig: Take 1 capsule (100 mg total) by mouth 3 (three) times daily.    Dispense:  90 capsule    Refill:  3   losartan (COZAAR) 100 MG tablet    Sig: Take 1 tablet (100 mg total) by mouth daily.    Dispense:  90 tablet    Refill:  1   atorvastatin (LIPITOR) 80 MG tablet    Sig: Take 1 tablet (80 mg total) by mouth daily.    Dispense:  90 tablet    Refill:  1   amLODipine (NORVASC) 10 MG tablet    Sig: Take 1 tablet (10 mg total) by mouth daily.    Dispense:  90 tablet    Refill:  1   aspirin EC 81 MG tablet    Sig: Take 1 tablet (81 mg total) by mouth daily. Swallow whole.    Dispense:  90 tablet    Refill:  2    Follow-up: Return in about 2 months (around 12/02/2023) for neuropathy.    Donell Beers, FNP

## 2023-10-01 NOTE — Patient Instructions (Addendum)
Please call physical therapy on [phone] to schedule an appointment   1. Blurry vision, bilateral  - Ambulatory referral to Ophthalmology  2. Left leg pain  - Ambulatory referral to Physical Therapy - gabapentin (NEURONTIN) 100 MG capsule; Take 1 capsule (100 mg total) by mouth 3 (three) times daily.  Dispense: 90 capsule; Refill: 3  3. Neuropathy  - Ambulatory referral to Physical Therapy - gabapentin (NEURONTIN) 100 MG capsule; Take 1 capsule (100 mg total) by mouth 3 (three) times daily.  Dispense: 90 capsule; Refill: 3     It is important that you exercise regularly at least 30 minutes 5 times a week as tolerated  Think about what you will eat, plan ahead. Choose " clean, green, fresh or frozen" over canned, processed or packaged foods which are more sugary, salty and fatty. 70 to 75% of food eaten should be vegetables and fruit. Three meals at set times with snacks allowed between meals, but they must be fruit or vegetables. Aim to eat over a 12 hour period , example 7 am to 7 pm, and STOP after  your last meal of the day. Drink water,generally about 64 ounces per day, no other drink is as healthy. Fruit juice is best enjoyed in a healthy way, by EATING the fruit.  Thanks for choosing [facility] we consider it a privelige to serve you.

## 2023-10-02 ENCOUNTER — Ambulatory Visit: Payer: 59 | Admitting: Nurse Practitioner

## 2023-10-02 DIAGNOSIS — M79602 Pain in left arm: Secondary | ICD-10-CM | POA: Insufficient documentation

## 2023-10-02 DIAGNOSIS — R29898 Other symptoms and signs involving the musculoskeletal system: Secondary | ICD-10-CM | POA: Insufficient documentation

## 2023-10-02 NOTE — Assessment & Plan Note (Signed)
- 

## 2023-10-02 NOTE — Assessment & Plan Note (Signed)
Patient referred to physical therapy .

## 2023-10-10 ENCOUNTER — Ambulatory Visit: Payer: 59 | Attending: Nurse Practitioner | Admitting: Physical Therapy

## 2023-10-11 ENCOUNTER — Ambulatory Visit: Payer: 59 | Admitting: Nurse Practitioner

## 2023-10-11 ENCOUNTER — Ambulatory Visit: Payer: 59 | Attending: Nurse Practitioner | Admitting: Physical Therapy

## 2023-10-11 DIAGNOSIS — I69354 Hemiplegia and hemiparesis following cerebral infarction affecting left non-dominant side: Secondary | ICD-10-CM | POA: Insufficient documentation

## 2023-10-11 DIAGNOSIS — M6281 Muscle weakness (generalized): Secondary | ICD-10-CM | POA: Insufficient documentation

## 2023-10-11 DIAGNOSIS — R2681 Unsteadiness on feet: Secondary | ICD-10-CM | POA: Insufficient documentation

## 2023-10-11 DIAGNOSIS — R2689 Other abnormalities of gait and mobility: Secondary | ICD-10-CM | POA: Insufficient documentation

## 2023-10-14 ENCOUNTER — Ambulatory Visit: Payer: 59 | Admitting: Physical Therapy

## 2023-10-14 ENCOUNTER — Telehealth: Payer: Self-pay | Admitting: Physical Therapy

## 2023-10-14 ENCOUNTER — Other Ambulatory Visit: Payer: Self-pay | Admitting: Nurse Practitioner

## 2023-10-14 ENCOUNTER — Encounter: Payer: Self-pay | Admitting: Physical Therapy

## 2023-10-14 ENCOUNTER — Other Ambulatory Visit: Payer: Self-pay

## 2023-10-14 VITALS — BP 142/89 | HR 57

## 2023-10-14 DIAGNOSIS — R29898 Other symptoms and signs involving the musculoskeletal system: Secondary | ICD-10-CM

## 2023-10-14 DIAGNOSIS — M6281 Muscle weakness (generalized): Secondary | ICD-10-CM

## 2023-10-14 DIAGNOSIS — M79602 Pain in left arm: Secondary | ICD-10-CM

## 2023-10-14 DIAGNOSIS — R2689 Other abnormalities of gait and mobility: Secondary | ICD-10-CM

## 2023-10-14 DIAGNOSIS — R2681 Unsteadiness on feet: Secondary | ICD-10-CM

## 2023-10-14 DIAGNOSIS — I69354 Hemiplegia and hemiparesis following cerebral infarction affecting left non-dominant side: Secondary | ICD-10-CM

## 2023-10-14 NOTE — Telephone Encounter (Signed)
Dr. Geoffery Spruce, Sherin Quarry was evaluated by PT on 10/14/2023.  The patient would benefit from OT evaluation for left UE dexterity deficits impacting ADLs like cooking.  He has other concerns about safety in the kitchen as well.  If you agree, please place an order in [facility] workque in [facility] or fax the order to [phone]. Thank you, Camille Bal, PT, DPT  [facility] [address] [place], [place]  [address] Phone:  [phone] Fax:  [phone]

## 2023-10-14 NOTE — Therapy (Signed)
OUTPATIENT PHYSICAL THERAPY NEURO EVALUATION   Patient Name: Luis Roth MRN: [id number] DOB:Mar 19, 1976, 47 y.o., male Today's Date: 10/14/2023   PCP: Donell Beers, FNP REFERRING PROVIDER: Donell Beers, FNP  END OF SESSION:  PT End of Session - 10/14/23 1149     Visit Number 1    Number of Visits 5   4 + eval   Date for PT Re-Evaluation 11/22/23    Authorization Type [employer]    PT Start Time 1139    PT Stop Time 1225    PT Time Calculation (min) 46 min    Equipment Utilized During Treatment Gait belt    Behavior During Therapy WFL for tasks assessed/performed             Past Medical History:  Diagnosis Date   CVA (cerebral vascular accident) (HCC)    Hyperlipidemia    Hypertension    LVH (left ventricular hypertrophy)    Past Surgical History:  Procedure Laterality Date   ANKLE FRACTURE SURGERY Right    BUBBLE STUDY  11/06/2022   Procedure: BUBBLE STUDY;  Surgeon: Thomasene Ripple, DO;  Location: [facility];  Service: Cardiovascular;;   ORIF ANKLE FRACTURE Right 2015   TEE WITHOUT CARDIOVERSION N/A 11/06/2022   Procedure: TRANSESOPHAGEAL ECHOCARDIOGRAM (TEE);  Surgeon: Thomasene Ripple, DO;  Location: [facility];  Service: Cardiovascular;  Laterality: N/A;   Patient Active Problem List   Diagnosis Date Noted   Left arm pain 10/02/2023   Left arm weakness 10/02/2023   Neuropathic pain 10/01/2023   Blurry vision, bilateral 10/01/2023   Allergic rhinitis 07/26/2023   Left leg weakness 07/26/2023   Left leg pain 07/26/2023   Lack of access to transportation 07/26/2023   Need for diphtheria-tetanus-pertussis (Tdap) vaccine 02/11/2023   Anxiety and depression 02/11/2023   Need for influenza vaccination 12/12/2022   Cognitive impairment 11/08/2022   LVH, severe 11/08/2022   Hypertensive urgency 11/07/2022   Primary hypertension 11/04/2022   Hyperlipidemia 11/03/2022   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)  11/03/2022   Acute CVA (cerebrovascular accident) (HCC) 11/02/2022   Abnormal ECG 11/02/2022    ONSET DATE: CVA November 02, 2022  REFERRING DIAG: M79.605 (ICD-10-CM) - Left leg pain M79.2 (ICD-10-CM) - Neuropathic pain R29.898 (ICD-10-CM) - Left arm weakness M79.602 (ICD-10-CM) - Left arm pain  THERAPY DIAG:  Hemiplegia and hemiparesis following cerebral infarction affecting left non-dominant side (HCC)  Muscle weakness (generalized)  Other abnormalities of gait and mobility  Unsteadiness on feet  Rationale for Evaluation and Treatment: Rehabilitation  SUBJECTIVE:  SUBJECTIVE STATEMENT: Pt reports he slept for a week instead of going to the hospital when he has his stroke.  He reports he could not climb the stairs.   Pt accompanied by: friend - friend provides transportation (waiting in parking lot)  PERTINENT HISTORY: right ACA CVA, Hyperlipidemia, Hypertension, and LVH (left ventricular hypertrophy)  PAIN:  Are you having pain? No  PRECAUTIONS: Fall  RED FLAGS: None   WEIGHT BEARING RESTRICTIONS: No  FALLS: Has patient fallen in last 6 months? No  LIVING ENVIRONMENT: Lives with: lives with an adult companion Lives in: House/apartment Stairs: Yes: Internal: 14-16 steps; can reach both Has following equipment at home: Single point cane, Walker - 2 wheeled, shower chair, bed side commode, and Grab bars  PLOF: Independent - he does not cook due to not wanting to use a knife for safety concerns  PATIENT GOALS: "If it is part of my wellbeing, that is okay."  OBJECTIVE:  Note: Objective measures were completed at Evaluation unless otherwise noted.  DIAGNOSTIC FINDINGS:  Cervical MRI 11/02/2022 IMPRESSION: 1. Motion degraded study. 2. Multilevel neural foraminal stenosis as above,  moderate on the left at C3-C4, moderate to severe on the left and moderate on the right at C4-C5, and severe on the left at C6-C7. No greater than mild spinal canal stenosis.  CT Angio Head Neck 11/02/2022 IMPRESSION: 1. 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 2. Moderate stenoses in the distal right P2 segment and superior P3 branches bilaterally. 3. Normal CTA of the neck.  MRI Brain 11/02/2022 IMPRESSION: Patchy acute to early subacute infarcts throughout the right ACA distribution without hemorrhage or mass effect.  COGNITION: Overall cognitive status: Within functional limits for tasks assessed   SENSATION: Light touch: WFL and pt reports soreness in the left hamstring and tingling in entire left leg  COORDINATION: LE RAMS: impaired Bilateral Heel-to-shin:  WFL  EDEMA:  None noted in BLE  MUSCLE TONE: No hypertonicity or clonus noted in BLE  POSTURE: forward head  LOWER EXTREMITY ROM:     Active  Right Eval Left Eval  Hip flexion WNL WNL  Hip extension    Hip abduction " "  Hip adduction " Cannot pull left LE to midline lacking ~10 degrees from midline in sitting  Hip internal rotation    Hip external rotation    Knee flexion " "  Knee extension " "  Ankle dorsiflexion " ~50%  Ankle plantarflexion    Ankle inversion    Ankle eversion     (Blank rows = not tested)  LOWER EXTREMITY MMT:    MMT Right Eval Left Eval  Hip flexion 4/5 3+/5  Hip extension    Hip abduction 4+/5 3/5  Hip adduction 4/5 2+/5  Hip internal rotation    Hip external rotation    Knee flexion 4-/5 3/5  Knee extension 5/5 3/5; hard to get pt to fully engage and hold muscle but he is able to stand and ambulate w/o device  Ankle dorsiflexion 5/5 2/5  Ankle plantarflexion    Ankle inversion    Ankle eversion    (Blank rows = not tested)  BED MOBILITY:  Pt ind in regular bed  TRANSFERS: Assistive  device utilized: None  Sit to stand: Complete Independence Stand to sit: Complete Independence Chair to chair: SBA  GAIT: Gait pattern: step to pattern, decreased step length- Right, decreased stance time- Left, decreased stride length, decreased ankle dorsiflexion- Left,  and poor foot clearance- Left Distance walked: various clinic distances Assistive device utilized: None Level of assistance: SBA Comments: Pt has no overt LOB, does not ambulate with bracing option, but might benefit from foot-up?  FUNCTIONAL TESTS:  5 times sit to stand: 13.88 sec w/ left hand on left knee, mild right weight shift into standing Timed up and go (TUG): 12.09 no AD SBA 10 meter walk test: 10.22 sec SBA no AD = 0.98 m/sec OR 3.23 ft/sec Functional gait assessment: To be assessed.  PATIENT SURVEYS:  FOTO Not captured at intake.                                                                                                                         TREATMENT DATE: N/A - eval only.   PATIENT EDUCATION: Education details: PT POC, assessments used and to be used, and goals to be set.  Discussed palpable spasms in left medial hamstring and having pt discuss medication management for this with FNP.  Edu on how these can be related to stroke or general weakness. Person educated: Patient Education method: Explanation Education comprehension: verbalized understanding and needs further education  HOME EXERCISE PROGRAM: To be established.  GOALS: Goals reviewed with patient? Yes  SHORT TERM GOALS = LONG TERM GOALS: Target date: 11/15/2023  Pt will be independent and compliant with strength and balance HEP in order to maintain functional progress and improve mobility. Baseline:  To be established. Goal status: INITIAL  2.  Pt will decrease 5xSTS to </=12 seconds w/ improved midline into standing in order to demonstrate decreased risk for falls and improved functional bilateral LE strength and power. Baseline:  13.88 sec R weight shift into standing Goal status: INITIAL  3.  Pt will demonstrate a gait speed of >/=1.08 m/sec in order to decrease risk for falls. Baseline: 0.98 m/sec Goal status: INITIAL  4.  FGA to be assessed w/ goal written as appropriate. Baseline: To be assessed. Goal status: INITIAL  5.  Pt to trial various bracing options to determine best fit with PT to place order request as needed. Baseline: To be assessed. Goal status: INITIAL  ASSESSMENT:  CLINICAL IMPRESSION: Patient is a 47 y.o. male who was seen today for physical therapy evaluation and treatment for left hemibody weakness following CVA.  Pt has a significant PMH of right ACA CVA, Hyperlipidemia, Hypertension, and LVH (left ventricular hypertrophy).  Identified impairments include palpable left medial hamstring spasm, impaired rapid movements, mild forward head posture, weight shift to right mildly during STS, decreased left DF which may benefit from bracing for ambulation.  Evaluation via the following assessment tools: 5xSTS and indicate mildly elevated fall risk.  TUG was WNL.  He would benefit from further assessment and skilled PT to address impairments as noted and progress towards long term goals.  OBJECTIVE IMPAIRMENTS: Abnormal gait, decreased balance, decreased coordination, decreased knowledge of condition, decreased knowledge of use of DME, decreased ROM, decreased strength, impaired sensation, improper body  mechanics, and postural dysfunction.   ACTIVITY LIMITATIONS: standing, squatting, stairs, and locomotion level  PARTICIPATION LIMITATIONS: meal prep and occupation  PERSONAL FACTORS: Past/current experiences, Time since onset of injury/illness/exacerbation, Transportation, and 1-2 comorbidities: HTN and chronic CVA  are also affecting patient's functional outcome.   REHAB POTENTIAL: Good  CLINICAL DECISION MAKING: Stable/uncomplicated  EVALUATION COMPLEXITY: Low  PLAN:  PT FREQUENCY:  1x/week (pt is unsure if transportation can bring him at even this low a frequency - pt states he will discuss it with him)  PT DURATION: 4 weeks  PLANNED INTERVENTIONS: 97164- PT Re-evaluation, 97110-Therapeutic exercises, 97530- Therapeutic activity, [id number]- Neuromuscular re-education, 97535- Self Care, 11914- Manual therapy, [phone]- Gait training, [phone]- Orthotic Fit/training, Patient/Family education, Balance training, Stair training, Vestibular training, and DME instructions  PLAN FOR NEXT SESSION: Assess FGA - set goals.  Assess bracing options (foot-up?).  Initiate functional strength and balance HEP.   Sadie Haber, PT, DPT 10/14/2023, 12:36 PM

## 2023-10-30 ENCOUNTER — Ambulatory Visit: Payer: Self-pay | Admitting: Nurse Practitioner

## 2023-10-31 ENCOUNTER — Ambulatory Visit (INDEPENDENT_AMBULATORY_CARE_PROVIDER_SITE_OTHER): Payer: 59 | Admitting: Neurology

## 2023-10-31 ENCOUNTER — Encounter: Payer: Self-pay | Admitting: Neurology

## 2023-10-31 VITALS — BP 158/91 | HR 62 | Ht 67.0 in | Wt 173.6 lb

## 2023-10-31 DIAGNOSIS — I69354 Hemiplegia and hemiparesis following cerebral infarction affecting left non-dominant side: Secondary | ICD-10-CM

## 2023-10-31 DIAGNOSIS — I639 Cerebral infarction, unspecified: Secondary | ICD-10-CM | POA: Diagnosis not present

## 2023-10-31 NOTE — Progress Notes (Signed)
 [facility] [address] [place]. [place] [address] [phone]       OFFICE FOLLOW-UP NOTE  [name] Date of Birth:  04-22-1976 Medical Record Number:  [id number]   HPI: [name] is a pleasant 48 year old [place] African male originally from [place] who is seen today for office follow-up visit following hospital admission for stroke in January 2024.  History is obtained from the patient and review of electronic medical records.  I personally reviewed pertinent available imaging films in PACS.  She has past medical history of hypertension, hyperlipidemia.  He presented on 11/02/2022 to [facility] with sudden onset of left-sided weakness and inability to stand.  His symptoms began a few days prior when he was getting into his car after work he noticed his left leg was weak and had to manually pick it up to put it into the car.  He also subsequently noticed left upper extremity weakness which was severe enough that he required assistance at home.  His cousin drove him to the emergency room required help to get her out of the car.  MRI scan showed a subacute right anterior cerebral artery infarct.  CT angiogram showed occlusion of the right pericallosal artery.  2D echo showed ejection fraction of 65 to 70%.  TEE shows no cardiac source of embolism or PFO.  TCD bubble study was negative for right-to-left shunt.  LDL cholesterol elevated 142 mg percent.  Hemoglobin A1c was 5.3.  ANA panel, HIV and RPR were all normal.  Urine drug screen was negative.  Hypercoagulable panel was also normal.  Patient was started on aspirin  Plavix  for 3 weeks followed by aspirin  alone.  He states he is tolerating aspirin  well without bruising or bleeding.  He is tolerating Lipitor  well and last lipid profile on 10//24 showed LDL-cholesterol to be optimal at 51 mg percent.  Patient still has his residual left hand as well as leg weakness and stiffness.  He is unable to return to his job.  He has  not federal disability and not been able to find another job yet.  He has no health insurance.  He is complaining of some difficulty focusing and reading.  See optometrist.  Has noticed the symptoms only for the last few months.  He continues to have mild occasional speech and word finding difficulties but it is unchanged..  Outpatient cardiac event monitor was ordered but not done probably since patient does not have insurance and cannot afford it.  He does not smoke cigarettes or drink alcohol using drugs. ROS:   14 system review of systems is positive for speech difficulty, hand weakness, leg weakness, difficulty walking all other systems negative  PMH:  Past Medical History:  Diagnosis Date   CVA (cerebral vascular accident) (HCC)    Hyperlipidemia    Hypertension    LVH (left ventricular hypertrophy)     Social History:  Social History   Socioeconomic History   Marital status: Single    Spouse name: Not on file   Number of children: 3   Years of education: Not on file   Highest education level: Not on file  Occupational History   Not on file  Tobacco Use   Smoking status: Never   Smokeless tobacco: Never  Vaping Use   Vaping status: Not on file  Substance and Sexual Activity   Alcohol use: Not Currently    Comment: occasionally   Drug use: Never   Sexual activity: Not on file  Other Topics Concern   Not on file  Social History Narrative   Lives with friend    Pt not working    Social Drivers of Corporate Investment Banker Strain: Not on file  Food Insecurity: No Food Insecurity (11/02/2022)   Hunger Vital Sign    Worried About Running Out of Food in the Last Year: Never true    Ran Out of Food in the Last Year: Never true  Transportation Needs: No Transportation Needs (11/02/2022)   PRAPARE - Administrator, Civil Service (Medical): No    Lack of Transportation (Non-Medical): No  Physical Activity: Not on file  Stress: Not on file  Social  Connections: Not on file  Intimate Partner Violence: Not At Risk (11/02/2022)   Humiliation, Afraid, Rape, and Kick questionnaire    Fear of Current or Ex-Partner: No    Emotionally Abused: No    Physically Abused: No    Sexually Abused: No    Medications:   Current Outpatient Medications on File Prior to Visit  Medication Sig Dispense Refill   amLODipine  (NORVASC ) 10 MG tablet Take 1 tablet (10 mg total) by mouth daily. 90 tablet 1   aspirin  EC 81 MG tablet Take 1 tablet (81 mg total) by mouth daily. Swallow whole. 90 tablet 2   atorvastatin  (LIPITOR ) 80 MG tablet Take 1 tablet (80 mg total) by mouth daily. 90 tablet 1   benzonatate  (TESSALON ) 100 MG capsule Take 1 capsule (100 mg total) by mouth 3 (three) times daily as needed for cough. 20 capsule 0   gabapentin  (NEURONTIN ) 100 MG capsule Take 1 capsule (100 mg total) by mouth 3 (three) times daily. 90 capsule 3   loratadine  (CLARITIN ) 10 MG tablet Take 1 tablet (10 mg total) by mouth daily. 30 tablet 11   losartan  (COZAAR ) 100 MG tablet Take 1 tablet (100 mg total) by mouth daily. 90 tablet 1   sertraline  (ZOLOFT ) 50 MG tablet Take 1 tablet (50 mg total) by mouth daily. 30 tablet 3   No current facility-administered medications on file prior to visit.    Allergies:   Allergies  Allergen Reactions   Quinine Derivatives Itching    Physical Exam General: well developed, well nourished middle-aged African male, seated, in no evident distress Head: head normocephalic and atraumatic.  Neck: supple with no carotid or supraclavicular bruits Cardiovascular: regular rate and rhythm, no murmurs Musculoskeletal: no deformity Skin:  no rash/petichiae Vascular:  Normal pulses all extremities Vitals:   10/31/23 1026  BP: (!) 158/91  Pulse: 62   Neurologic Exam Mental Status: Awake and fully alert. Oriented to place and time. Recent and remote memory intact. Attention span, concentration and fund of knowledge appropriate. Mood and  affect appropriate.  Cranial Nerves: Fundoscopic exam reveals sharp disc margins. Pupils equal, briskly reactive to light. Extraocular movements full without nystagmus. Visual fields full to confrontation. Hearing intact. Facial sensation intact.  Mild left lower facial asymmetry.  Tongue, palate moves normally and symmetrically.  Motor: Mild spastic left hemiparesis with 4/5 strength with weakness of left grip and intrinsic hand muscles.  Mild ankle dorsiflexor weakness.  Tone is increased on the left with mild spasticity.  Normal strength on the right.   Sensory.: intact to touch ,pinprick .position and vibratory sensation.  Coordination: Rapid alternating movements normal in all extremities. Finger-to-nose and heel-to-shin performed accurately bilaterally. Gait and Station: Arises from chair without difficulty. Stance is normal. Gait mild circumduction and dragging of the left  foot.  Uses a cane.   Reflexes: 2+ and  asymmetric and brisker on the left toes downgoing.   NIHSS  1 Modified Rankin  3   ASSESSMENT: 48 year old [place] male with right ACA infarct and January 2024 of cryptogenic etiology.  Vascular risk factors of hypertension, hyperlipidemia.     PLAN:I had a long d/w patient about his recent stroke, risk for recurrent stroke/TIAs, residual left-sided weakness, personally independently reviewed imaging studies and stroke evaluation results and answered questions.Continue aspirin  81 mg daily  for secondary stroke prevention and maintain strict control of hypertension with blood pressure goal below 130/90, diabetes with hemoglobin A1c goal below 6.5% and lipids with LDL cholesterol goal below 70 mg/dL. I also advised the patient to eat a healthy diet with plenty of whole grains, cereals, fruits and vegetables, exercise regularly and maintain ideal body weight Followup in the future with me only as needed and no scheduled follow-up appointment was made.  Greater than 50% of time  during this 35 minute visit was spent on counseling,explanation of diagnosis of cryptogenic stroke and left hemiparesis, planning of further management, discussion with patient and family and coordination of care Eather Popp, MD Note: This document was prepared with digital dictation and possible smart phrase technology. Any transcriptional errors that result from this process are unintentional

## 2023-10-31 NOTE — Patient Instructions (Signed)
 I had a long d/w patient about his recent stroke, risk for recurrent stroke/TIAs, residual left-sided weakness, personally independently reviewed imaging studies and stroke evaluation results and answered questions.Continue aspirin  81 mg daily  for secondary stroke prevention and maintain strict control of hypertension with blood pressure goal below 130/90, diabetes with hemoglobin A1c goal below 6.5% and lipids with LDL cholesterol goal below 70 mg/dL. I also advised the patient to eat a healthy diet with plenty of whole grains, cereals, fruits and vegetables, exercise regularly and maintain ideal body weight Followup in the future with me only as needed and no scheduled follow-up appointment was made.  Stroke Prevention Some medical conditions and behaviors can lead to a higher chance of having a stroke. You can help prevent a stroke by eating healthy, exercising, not smoking, and managing any medical conditions you have. Stroke is a leading cause of functional impairment. Primary prevention is particularly important because a majority of strokes are first-time events. Stroke changes the lives of not only those who experience a stroke but also their family and other caregivers. How can this condition affect me? A stroke is a medical emergency and should be treated right away. A stroke can lead to brain damage and can sometimes be life-threatening. If a person gets medical treatment right away, there is a better chance of surviving and recovering from a stroke. What can increase my risk? The following medical conditions may increase your risk of a stroke: Cardiovascular disease. High blood pressure (hypertension). Diabetes. High cholesterol. Sickle cell disease. Blood clotting disorders (hypercoagulable state). Obesity. Sleep disorders (obstructive sleep apnea). Other risk factors include: Being older than age 23. Having a history of blood clots, stroke, or mini-stroke (transient ischemic attack,  TIA). Genetic factors, such as race, ethnicity, or a family history of stroke. Smoking cigarettes or using other tobacco products. Taking birth control pills, especially if you also use tobacco. Heavy use of alcohol or drugs, especially cocaine and methamphetamine. Physical inactivity. What actions can I take to prevent this? Manage your health conditions High cholesterol levels. Eating a healthy diet is important for preventing high cholesterol. If cholesterol cannot be managed through diet alone, you may need to take medicines. Take any prescribed medicines to control your cholesterol as told by your health care provider. Hypertension. To reduce your risk of stroke, try to keep your blood pressure below 130/80. Eating a healthy diet and exercising regularly are important for controlling blood pressure. If these steps are not enough to manage your blood pressure, you may need to take medicines. Take any prescribed medicines to control hypertension as told by your health care provider. Ask your health care provider if you should monitor your blood pressure at home. Have your blood pressure checked every year, even if your blood pressure is normal. Blood pressure increases with age and some medical conditions. Diabetes. Eating a healthy diet and exercising regularly are important parts of managing your blood sugar (glucose). If your blood sugar cannot be managed through diet and exercise, you may need to take medicines. Take any prescribed medicines to control your diabetes as told by your health care provider. Get evaluated for obstructive sleep apnea. Talk to your health care provider about getting a sleep evaluation if you snore a lot or have excessive sleepiness. Make sure that any other medical conditions you have, such as atrial fibrillation or atherosclerosis, are managed. Nutrition Follow instructions from your health care provider about what to eat or drink to help manage your health  condition. These instructions may include: Reducing your daily calorie intake. Limiting how much salt (sodium) you use to 1,500 milligrams (mg) each day. Using only healthy fats for cooking, such as olive oil, canola oil, or sunflower oil. Eating healthy foods. You can do this by: Choosing foods that are high in fiber, such as whole grains, and fresh fruits and vegetables. Eating at least 5 servings of fruits and vegetables a day. Try to fill one-half of your plate with fruits and vegetables at each meal. Choosing lean protein foods, such as lean cuts of meat, poultry without skin, fish, tofu, beans, and nuts. Eating low-fat dairy products. Avoiding foods that are high in sodium. This can help lower blood pressure. Avoiding foods that have saturated fat, trans fat, and cholesterol. This can help prevent high cholesterol. Avoiding processed and prepared foods. Counting your daily carbohydrate intake.  Lifestyle If you drink alcohol: Limit how much you have to: 0-1 drink a day for women who are not pregnant. 0-2 drinks a day for men. Know how much alcohol is in your drink. In the [place] one drink equals one 12 oz bottle of beer ( ), one 5 oz glass of wine ( ), or one 1 oz glass of hard liquor (44mL). Do not use any products that contain nicotine or tobacco. These products include cigarettes, chewing tobacco, and vaping devices, such as e-cigarettes. If you need help quitting, ask your health care provider. Avoid secondhand smoke. Do not use drugs. Activity  Try to stay at a healthy weight. Get at least 30 minutes of exercise on most days, such as: Fast walking. Biking. Swimming. Medicines Take over-the-counter and prescription medicines only as told by your health care provider. Aspirin  or blood thinners (antiplatelets or anticoagulants) may be recommended to reduce your risk of forming blood clots that can lead to stroke. Avoid taking birth control pills. Talk to your health  care provider about the risks of taking birth control pills if: You are over 23 years old. You smoke. You get very bad headaches. You have had a blood clot. Where to find more information American Stroke Association: www.strokeassociation.org Get help right away if: You or a loved one has any symptoms of a stroke. BE FAST is an easy way to remember the main warning signs of a stroke: B - Balance. Signs are dizziness, sudden trouble walking, or loss of balance. E - Eyes. Signs are trouble seeing or a sudden change in vision. F - Face. Signs are sudden weakness or numbness of the face, or the face or eyelid drooping on one side. A - Arms. Signs are weakness or numbness in an arm. This happens suddenly and usually on one side of the body. S - Speech. Signs are sudden trouble speaking, slurred speech, or trouble understanding what people say. T - Time. Time to call emergency services. Write down what time symptoms started. You or a loved one has other signs of a stroke, such as: A sudden, severe headache with no known cause. Nausea or vomiting. Seizure. These symptoms may represent a serious problem that is an emergency. Do not wait to see if the symptoms will go away. Get medical help right away. Call your local emergency services (911 in the [place] Do not drive yourself to the hospital. Summary You can help to prevent a stroke by eating healthy, exercising, not smoking, limiting alcohol intake, and managing any medical conditions you may have. Do not use any products that contain nicotine or tobacco. These include cigarettes, chewing  tobacco, and vaping devices, such as e-cigarettes. If you need help quitting, ask your health care provider. Remember BE FAST for warning signs of a stroke. Get help right away if you or a loved one has any of these signs. This information is not intended to replace advice given to you by your health care provider. Make sure you discuss any questions you have  with your health care provider. Document Revised: 09/10/2022 Document Reviewed: 09/10/2022 [employer]vier Patient Education  2024 [employer].

## 2023-11-12 ENCOUNTER — Ambulatory Visit: Payer: Self-pay | Admitting: Nurse Practitioner
